# Patient Record
Sex: Male | Born: 1955 | Race: White | Hispanic: No | Marital: Married | Smoking: Former smoker
Health system: Southern US, Community
[De-identification: ages and names within clinical notes are randomized; demographics above are authoritative.]

## PROBLEM LIST (undated history)

## (undated) DIAGNOSIS — Z972 Presence of dental prosthetic device (complete) (partial): Secondary | ICD-10-CM

## (undated) HISTORY — PX: COLONOSCOPY: SHX174

---

## 2003-12-08 ENCOUNTER — Other Ambulatory Visit: Payer: Self-pay

## 2009-04-20 ENCOUNTER — Ambulatory Visit: Payer: Self-pay | Admitting: Gastroenterology

## 2009-04-20 LAB — HM COLONOSCOPY

## 2015-05-12 ENCOUNTER — Encounter: Payer: Self-pay | Admitting: Family Medicine

## 2015-05-14 ENCOUNTER — Other Ambulatory Visit: Payer: Self-pay | Admitting: Family Medicine

## 2015-05-19 ENCOUNTER — Ambulatory Visit (INDEPENDENT_AMBULATORY_CARE_PROVIDER_SITE_OTHER): Payer: BLUE CROSS/BLUE SHIELD | Admitting: Family Medicine

## 2015-05-19 ENCOUNTER — Encounter: Payer: Self-pay | Admitting: Family Medicine

## 2015-05-19 VITALS — BP 108/69 | HR 65 | Temp 98.0°F | Ht 67.8 in | Wt 150.0 lb

## 2015-05-19 DIAGNOSIS — Z0000 Encounter for general adult medical examination without abnormal findings: Secondary | ICD-10-CM | POA: Diagnosis not present

## 2015-05-19 LAB — URINALYSIS, ROUTINE W REFLEX MICROSCOPIC
Bilirubin, UA: NEGATIVE
Glucose, UA: NEGATIVE
Ketones, UA: NEGATIVE
Leukocytes, UA: NEGATIVE
Nitrite, UA: NEGATIVE
RBC UA: NEGATIVE
Specific Gravity, UA: 1.03 (ref 1.005–1.030)
Urobilinogen, Ur: 0.2 mg/dL (ref 0.2–1.0)
pH, UA: 5.5 (ref 5.0–7.5)

## 2015-05-19 NOTE — Progress Notes (Signed)
   BP 108/69 mmHg  Pulse 65  Temp(Src) 98 F (36.7 C)  Ht 5' 7.8" (1.722 m)  Wt 150 lb (68.04 kg)  BMI 22.95 kg/m2  SpO2 96%   Subjective:    Patient ID: Jesse Koch, male    DOB: 30-Nov-1955, 59 y.o.   MRN: [id number]  HPI: Jesse Koch is a 59 y.o. male  Chief Complaint  Patient presents with  . Annual Exam   Doing well No problems occ fever blister Relevant past medical, surgical, family and social history reviewed and updated as indicated. Interim medical history since our last visit reviewed. Allergies and medications reviewed and updated.  Review of Systems  Constitutional: Negative.   HENT: Negative.   Eyes: Negative.   Respiratory: Negative.   Cardiovascular: Negative.   Endocrine: Negative.   Musculoskeletal: Negative.   Skin: Negative.   Allergic/Immunologic: Negative.   Neurological: Negative.   Hematological: Negative.   Psychiatric/Behavioral: Negative.     Per HPI unless specifically indicated above     Objective:    BP 108/69 mmHg  Pulse 65  Temp(Src) 98 F (36.7 C)  Ht 5' 7.8" (1.722 m)  Wt 150 lb (68.04 kg)  BMI 22.95 kg/m2  SpO2 96%  Wt Readings from Last 3 Encounters:  05/19/15 150 lb (68.04 kg)  04/14/14 147 lb (66.679 kg)    Physical Exam  Constitutional: He is oriented to person, place, and time. He appears well-developed and well-nourished.  HENT:  Head: Normocephalic and atraumatic.  Right Ear: External ear normal.  Left Ear: External ear normal.  Eyes: Conjunctivae and EOM are normal. Pupils are equal, round, and reactive to light.  Neck: Normal range of motion. Neck supple.  Cardiovascular: Normal rate, regular rhythm, normal heart sounds and intact distal pulses.   Pulmonary/Chest: Effort normal and breath sounds normal.  Abdominal: Soft. Bowel sounds are normal. There is no splenomegaly or hepatomegaly.  Genitourinary: Rectum normal, prostate normal and penis normal.  Musculoskeletal: Normal range of motion.  Neurological:  He is alert and oriented to person, place, and time. He has normal reflexes.  Skin: No rash noted. No erythema.  Psychiatric: He has a normal mood and affect. His behavior is normal. Judgment and thought content normal.    No results found for this or any previous visit.    Assessment & Plan:   Problem List Items Addressed This Visit    None    Visit Diagnoses    PE (physical exam), annual    -  Primary    Relevant Orders    Comprehensive metabolic panel    CBC with Differential/Platelet    Urinalysis, Routine w reflex microscopic (not at [facility])    TSH    PSA    Lipid panel        Follow up plan: Return for Physical Exam.

## 2015-05-20 LAB — TSH: TSH: 2.11 u[IU]/mL (ref 0.450–4.500)

## 2015-05-20 LAB — COMPREHENSIVE METABOLIC PANEL
A/G RATIO: 2 (ref 1.1–2.5)
ALK PHOS: 76 IU/L (ref 39–117)
ALT: 19 IU/L (ref 0–44)
AST: 17 IU/L (ref 0–40)
Albumin: 4.3 g/dL (ref 3.5–5.5)
BILIRUBIN TOTAL: 0.5 mg/dL (ref 0.0–1.2)
BUN / CREAT RATIO: 18 (ref 9–20)
BUN: 17 mg/dL (ref 6–24)
CO2: 25 mmol/L (ref 18–29)
CREATININE: 0.96 mg/dL (ref 0.76–1.27)
Calcium: 9.1 mg/dL (ref 8.7–10.2)
Chloride: 100 mmol/L (ref 97–108)
GFR calc Af Amer: 102 mL/min/{1.73_m2} (ref >59)
GFR calc non Af Amer: 88 mL/min/{1.73_m2} (ref >59)
GLOBULIN, TOTAL: 2.2 g/dL (ref 1.5–4.5)
Glucose: 75 mg/dL (ref 65–99)
POTASSIUM: 4 mmol/L (ref 3.5–5.2)
SODIUM: 140 mmol/L (ref 134–144)
Total Protein: 6.5 g/dL (ref 6.0–8.5)

## 2015-05-20 LAB — LIPID PANEL
Chol/HDL Ratio: 3 ratio units (ref 0.0–5.0)
Cholesterol, Total: 140 mg/dL (ref 100–199)
HDL: 47 mg/dL (ref >39)
LDL CALC: 80 mg/dL (ref 0–99)
TRIGLYCERIDES: 67 mg/dL (ref 0–149)
VLDL Cholesterol Cal: 13 mg/dL (ref 5–40)

## 2015-05-20 LAB — CBC WITH DIFFERENTIAL/PLATELET
BASOS ABS: 0 10*3/uL (ref 0.0–0.2)
Basos: 0 %
EOS (ABSOLUTE): 0.1 10*3/uL (ref 0.0–0.4)
Eos: 2 %
Hematocrit: 42.7 % (ref 37.5–51.0)
Hemoglobin: 15.1 g/dL (ref 12.6–17.7)
IMMATURE GRANULOCYTES: 0 %
Immature Grans (Abs): 0 10*3/uL (ref 0.0–0.1)
Lymphocytes Absolute: 1.5 10*3/uL (ref 0.7–3.1)
Lymphs: 23 %
MCH: 31.8 pg (ref 26.6–33.0)
MCHC: 35.4 g/dL (ref 31.5–35.7)
MCV: 90 fL (ref 79–97)
Monocytes Absolute: 0.5 10*3/uL (ref 0.1–0.9)
Monocytes: 8 %
NEUTROS ABS: 4.1 10*3/uL (ref 1.4–7.0)
NEUTROS PCT: 67 %
Platelets: 258 10*3/uL (ref 150–379)
RBC: 4.75 x10E6/uL (ref 4.14–5.80)
RDW: 13.5 % (ref 12.3–15.4)
WBC: 6.2 10*3/uL (ref 3.4–10.8)

## 2015-05-20 LAB — PSA: PROSTATE SPECIFIC AG, SERUM: 1 ng/mL (ref 0.0–4.0)

## 2015-07-05 ENCOUNTER — Telehealth: Payer: Self-pay | Admitting: Family Medicine

## 2015-07-05 NOTE — Telephone Encounter (Signed)
Please call pt ASAP, stated he has been waiting 2 months on lab results. Thanks.

## 2015-08-09 ENCOUNTER — Encounter: Payer: Self-pay | Admitting: Family Medicine

## 2015-08-09 ENCOUNTER — Ambulatory Visit (INDEPENDENT_AMBULATORY_CARE_PROVIDER_SITE_OTHER): Payer: BLUE CROSS/BLUE SHIELD | Admitting: Family Medicine

## 2015-08-09 VITALS — BP 109/57 | HR 63 | Temp 97.6°F | Wt 150.0 lb

## 2015-08-09 DIAGNOSIS — M545 Low back pain: Secondary | ICD-10-CM

## 2015-08-09 DIAGNOSIS — M62830 Muscle spasm of back: Secondary | ICD-10-CM

## 2015-08-09 LAB — UA/M W/RFLX CULTURE, ROUTINE
Bilirubin, UA: NEGATIVE
GLUCOSE, UA: NEGATIVE
KETONES UA: NEGATIVE
LEUKOCYTES UA: NEGATIVE
NITRITE UA: NEGATIVE
PROTEIN UA: NEGATIVE
RBC, UA: NEGATIVE
SPEC GRAV UA: 1.015 (ref 1.005–1.030)
Urobilinogen, Ur: 1 mg/dL (ref 0.2–1.0)
pH, UA: 7 (ref 5.0–7.5)

## 2015-08-09 MED ORDER — HYDROCODONE-ACETAMINOPHEN 5-325 MG PO TABS: 1.0000 | ORAL_TABLET | Freq: Four times a day (QID) | ORAL | Status: DC | PRN

## 2015-08-09 MED ORDER — CYCLOBENZAPRINE HCL 10 MG PO TABS: 10.0000 mg | ORAL_TABLET | Freq: Three times a day (TID) | ORAL | Status: DC | PRN

## 2015-08-09 NOTE — Progress Notes (Signed)
BP 109/57 mmHg  Pulse 63  Temp(Src) 97.6 F (36.4 C)  Wt 150 lb (68.04 kg)  SpO2 99%   Subjective:    Patient ID: Jesse Koch, male    DOB: Aug 03, 1956, 59 y.o.   MRN: [id number]  HPI: Jesse Koch is a 59 y.o. male  Chief Complaint  Patient presents with  . Back Pain    X 1 week, patient has went to the chiropractor twice without any relief, so the chiropractor said something else is going on.  Left Flank Pain Pt presents to office today with c/o left flank pain onset 1 week ago.  Quality of pain is dull, pain is worse in the morning. Has went to the chiropractor twice since symptoms began without relief.  Has taken Ibuprofen 400 mg twice a day and hydrocodeine as needed for pain with relief of symptoms.  Has been drinking cranberry juice and has increased water intake.  Pertinent negatives denies trauma, dysuria, polyuria, urgency, frequency, numbness, , weakness, or bowel or bladder incontinence.   Relevant past medical, surgical, family and social history reviewed and updated as indicated. Interim medical history since our last visit reviewed. Allergies and medications reviewed and updated.  Review of Systems  Constitutional: Negative.   Cardiovascular: Negative for chest pain and palpitations.  Genitourinary: Negative for urgency, hematuria, decreased urine volume, discharge and penile pain.       Left flank pain  Neurological: Negative for numbness.    Per HPI unless specifically indicated above     Objective:    BP 109/57 mmHg  Pulse 63  Temp(Src) 97.6 F (36.4 C)  Wt 150 lb (68.04 kg)  SpO2 99%  Wt Readings from Last 3 Encounters:  08/09/15 150 lb (68.04 kg)  05/19/15 150 lb (68.04 kg)  04/14/14 147 lb (66.679 kg)    Physical Exam  Constitutional: He is oriented to person, place, and time. He appears well-developed and well-nourished. No distress.  HENT:  Head: Normocephalic and atraumatic.  Right Ear: External ear normal.  Left Ear: External ear normal.   Nose: Nose normal.  Neck: Normal range of motion. Neck supple.  Cardiovascular: Normal rate, regular rhythm and normal heart sounds.   Pulmonary/Chest: Effort normal and breath sounds normal. No respiratory distress. He exhibits no tenderness.  Musculoskeletal: Normal range of motion. He exhibits no edema.  Tenderness and tightness present at left flank.  FROM of back present.  Neurological: He is alert and oriented to person, place, and time.  Skin: Skin is warm and dry. He is not diaphoretic.  Psychiatric: He has a normal mood and affect. His behavior is normal. Judgment and thought content normal.    Results for orders placed or performed in visit on 05/19/15  Comprehensive metabolic panel  Result Value Ref Range   Glucose 75 65 - 99 mg/dL   BUN 17 6 - 24 mg/dL   Creatinine, Ser 0.96 0.76 - 1.27 mg/dL   GFR calc non Af Amer 88 >59 mL/min/1.73   GFR calc Af Amer 102 >59 mL/min/1.73   BUN/Creatinine Ratio 18 9 - 20   Sodium 140 134 - 144 mmol/L   Potassium 4.0 3.5 - 5.2 mmol/L   Chloride 100 97 - 108 mmol/L   CO2 25 18 - 29 mmol/L   Calcium 9.1 8.7 - 10.2 mg/dL   Total Protein 6.5 6.0 - 8.5 g/dL   Albumin 4.3 3.5 - 5.5 g/dL   Globulin, Total 2.2 1.5 - 4.5 g/dL   Albumin/Globulin  Ratio 2.0 1.1 - 2.5   Bilirubin Total 0.5 0.0 - 1.2 mg/dL   Alkaline Phosphatase 76 39 - 117 IU/L   AST 17 0 - 40 IU/L   ALT 19 0 - 44 IU/L  CBC with Differential/Platelet  Result Value Ref Range   WBC 6.2 3.4 - 10.8 x10E3/uL   RBC 4.75 4.14 - 5.80 x10E6/uL   Hemoglobin 15.1 12.6 - 17.7 g/dL   Hematocrit 42.7 37.5 - 51.0 %   MCV 90 79 - 97 fL   MCH 31.8 26.6 - 33.0 pg   MCHC 35.4 31.5 - 35.7 g/dL   RDW 13.5 12.3 - 15.4 %   Platelets 258 150 - 379 x10E3/uL   Neutrophils 67 %   Lymphs 23 %   Monocytes 8 %   Eos 2 %   Basos 0 %   Neutrophils Absolute 4.1 1.4 - 7.0 x10E3/uL   Lymphocytes Absolute 1.5 0.7 - 3.1 x10E3/uL   Monocytes Absolute 0.5 0.1 - 0.9 x10E3/uL   EOS (ABSOLUTE) 0.1 0.0 -  0.4 x10E3/uL   Basophils Absolute 0.0 0.0 - 0.2 x10E3/uL   Immature Granulocytes 0 %   Immature Grans (Abs) 0.0 0.0 - 0.1 x10E3/uL  Urinalysis, Routine w reflex microscopic (not at [facility])  Result Value Ref Range   Specific Gravity, UA 1.030 1.005 - 1.030   pH, UA 5.5 5.0 - 7.5   Color, UA Yellow Yellow   Appearance Ur Clear Clear   Leukocytes, UA Negative Negative   Protein, UA Trace Negative/Trace   Glucose, UA Negative Negative   Ketones, UA Negative Negative   RBC, UA Negative Negative   Bilirubin, UA Negative Negative   Urobilinogen, Ur 0.2 0.2 - 1.0 mg/dL   Nitrite, UA Negative Negative  TSH  Result Value Ref Range   TSH 2.110 0.450 - 4.500 uIU/mL  PSA  Result Value Ref Range   Prostate Specific Ag, Serum 1.0 0.0 - 4.0 ng/mL  Lipid panel  Result Value Ref Range   Cholesterol, Total 140 100 - 199 mg/dL   Triglycerides 67 0 - 149 mg/dL   HDL 47 >39 mg/dL   VLDL Cholesterol Cal 13 5 - 40 mg/dL   LDL Calculated 80 0 - 99 mg/dL   Chol/HDL Ratio 3.0 0.0 - 5.0 ratio units      Assessment & Plan:   Problem List Items Addressed This Visit      Other   Muscle spasm of back    Prescribed Cyclobenazaprine  (Flexiril) 10 mg 3 times a day as needed for muscle spasms Prescribed Hydrocodone-Acetaminophen 5-325 1 tab every 6 hours as needed for pain Instructed pt not to drive if he takes the abovementioned medications       Other Visit Diagnoses    Low back pain without sciatica, unspecified back pain laterality    -  Primary    Relevant Medications    HYDROcodone-acetaminophen (NORCO/VICODIN) 5-325 MG per tablet    cyclobenzaprine (FLEXERIL) 10 MG tablet    Other Relevant Orders    UA/M w/rflx Culture, Routine        Follow up plan: Return if symptoms worsen or fail to improve.    Seen today with Hinton Dyer, NP student.

## 2015-08-09 NOTE — Assessment & Plan Note (Signed)
Prescribed Cyclobenazaprine  (Flexiril) 10 mg 3 times a day as needed for muscle spasms Prescribed Hydrocodone-Acetaminophen 5-325 1 tab every 6 hours as needed for pain Instructed pt not to drive if he takes the abovementioned medications

## 2015-08-11 ENCOUNTER — Telehealth: Payer: Self-pay | Admitting: Family Medicine

## 2015-08-11 DIAGNOSIS — M545 Low back pain, unspecified: Secondary | ICD-10-CM

## 2015-08-11 NOTE — Telephone Encounter (Signed)
Pt called requests call back from Dr. Wynetta Emery. No further information provided. Thanks

## 2015-08-12 NOTE — Telephone Encounter (Signed)
Yes. Ordered. He can go to [place] to get it done

## 2015-08-12 NOTE — Telephone Encounter (Signed)
Called and spoke with patient, was wanted to know if the flexeril can cause drowsiness, let patient know that this is a possibility. Patient would like to know if you can order him an xray of his back.

## 2015-08-12 NOTE — Telephone Encounter (Signed)
Patient notified

## 2015-08-16 ENCOUNTER — Ambulatory Visit
Admission: RE | Admit: 2015-08-16 | Discharge: 2015-08-16 | Disposition: A | Discharge status: Home / Self Care | Payer: BLUE CROSS/BLUE SHIELD | Source: Ambulatory Visit | Attending: Family Medicine | Admitting: Family Medicine

## 2015-08-16 DIAGNOSIS — M545 Low back pain, unspecified: Secondary | ICD-10-CM

## 2015-08-16 DIAGNOSIS — I2510 Atherosclerotic heart disease of native coronary artery without angina pectoris: Secondary | ICD-10-CM | POA: Diagnosis not present

## 2015-08-17 ENCOUNTER — Telehealth: Payer: Self-pay | Admitting: Family Medicine

## 2015-08-17 DIAGNOSIS — M47816 Spondylosis without myelopathy or radiculopathy, lumbar region: Secondary | ICD-10-CM

## 2015-08-17 DIAGNOSIS — I700 Atherosclerosis of aorta: Secondary | ICD-10-CM

## 2015-08-17 NOTE — Telephone Encounter (Signed)
X-ray shows arthritis. Would likely benefit from PT. OK to let him know when he calls.

## 2015-08-18 NOTE — Telephone Encounter (Signed)
He is feeling better and doesn't think that he needs PT at this time. Will call if situation changes. Results given.

## 2016-02-22 ENCOUNTER — Encounter: Payer: Self-pay | Admitting: Family Medicine

## 2016-03-22 ENCOUNTER — Encounter: Payer: Self-pay | Admitting: Family Medicine

## 2016-05-22 ENCOUNTER — Encounter: Payer: BLUE CROSS/BLUE SHIELD | Admitting: Family Medicine

## 2016-07-11 ENCOUNTER — Ambulatory Visit (INDEPENDENT_AMBULATORY_CARE_PROVIDER_SITE_OTHER): Payer: BLUE CROSS/BLUE SHIELD | Admitting: Family Medicine

## 2016-07-11 ENCOUNTER — Encounter: Payer: Self-pay | Admitting: Family Medicine

## 2016-07-11 VITALS — BP 117/72 | HR 62 | Temp 97.3°F | Ht 68.1 in | Wt 149.8 lb

## 2016-07-11 DIAGNOSIS — Z0000 Encounter for general adult medical examination without abnormal findings: Secondary | ICD-10-CM | POA: Diagnosis not present

## 2016-07-11 LAB — URINALYSIS, ROUTINE W REFLEX MICROSCOPIC
BILIRUBIN UA: NEGATIVE
Glucose, UA: NEGATIVE
KETONES UA: NEGATIVE
LEUKOCYTES UA: NEGATIVE
NITRITE UA: NEGATIVE
Protein, UA: NEGATIVE
RBC UA: NEGATIVE
SPEC GRAV UA: 1.02 (ref 1.005–1.030)
UUROB: 0.2 mg/dL (ref 0.2–1.0)
pH, UA: 5.5 (ref 5.0–7.5)

## 2016-07-11 MED ORDER — FAMCICLOVIR 500 MG PO TABS: 500.0000 mg | ORAL_TABLET | Freq: Two times a day (BID) | ORAL | 12 refills | Status: DC

## 2016-07-11 NOTE — Progress Notes (Signed)
   BP 117/72 (BP Location: Left Arm, Patient Position: Sitting, Cuff Size: Normal)   Pulse 62   Temp 97.3 F (36.3 C)   Ht 5' 8.1" (1.73 m)   Wt 149 lb 12.8 oz (67.9 kg)   SpO2 98%   BMI 22.71 kg/m    Subjective:    Patient ID: Jesse Koch, male    DOB: 06/15/1956, 60 y.o.   MRN: [id number]  HPI: Jesse Koch is a 61 y.o. male  Chief Complaint  Patient presents with  . Annual Exam    Relevant past medical, surgical, family and social history reviewed and updated as indicated. Interim medical history since our last visit reviewed. Allergies and medications reviewed and updated.  Review of Systems  Constitutional: Negative.   HENT: Negative.   Eyes: Negative.   Respiratory: Negative.   Cardiovascular: Negative.   Gastrointestinal: Negative.   Endocrine: Negative.   Genitourinary: Negative.   Musculoskeletal: Negative.   Skin: Negative.   Allergic/Immunologic: Negative.   Neurological: Negative.   Hematological: Negative.   Psychiatric/Behavioral: Negative.     Per HPI unless specifically indicated above     Objective:    BP 117/72 (BP Location: Left Arm, Patient Position: Sitting, Cuff Size: Normal)   Pulse 62   Temp 97.3 F (36.3 C)   Ht 5' 8.1" (1.73 m)   Wt 149 lb 12.8 oz (67.9 kg)   SpO2 98%   BMI 22.71 kg/m   Wt Readings from Last 3 Encounters:  07/11/16 149 lb 12.8 oz (67.9 kg)  08/09/15 150 lb (68 kg)  05/19/15 150 lb (68 kg)    Physical Exam  Constitutional: He is oriented to person, place, and time. He appears well-developed and well-nourished.  HENT:  Head: Normocephalic and atraumatic.  Right Ear: External ear normal.  Left Ear: External ear normal.  Eyes: Conjunctivae and EOM are normal. Pupils are equal, round, and reactive to light.  Neck: Normal range of motion. Neck supple.  Cardiovascular: Normal rate, regular rhythm, normal heart sounds and intact distal pulses.   Pulmonary/Chest: Effort normal and breath sounds normal.  Abdominal:  Soft. Bowel sounds are normal. There is no splenomegaly or hepatomegaly.  Genitourinary: Rectum normal, prostate normal and penis normal.  Musculoskeletal: Normal range of motion.  Neurological: He is alert and oriented to person, place, and time. He has normal reflexes.  Skin: No rash noted. No erythema.  Psychiatric: He has a normal mood and affect. His behavior is normal. Judgment and thought content normal.    Results for orders placed or performed in visit on 07/11/16  HM COLONOSCOPY  Result Value Ref Range   HM Colonoscopy See Report (in chart) See Report (in chart), Patient Reported      Assessment & Plan:   Problem List Items Addressed This Visit    None    Visit Diagnoses    Annual physical exam    -  Primary   Relevant Orders   CBC with Differential/Platelet   Comprehensive metabolic panel   Lipid Panel w/o Chol/HDL Ratio   PSA   TSH   Urinalysis, Routine w reflex microscopic (not at [facility])   Health care maintenance       Relevant Orders   Hepatitis C antibody   HIV antibody       Follow up plan: Return in about 1 year (around 07/11/2017) for Physical Exam.

## 2016-07-12 ENCOUNTER — Encounter: Payer: Self-pay | Admitting: Family Medicine

## 2016-07-12 LAB — LIPID PANEL W/O CHOL/HDL RATIO
Cholesterol, Total: 137 mg/dL (ref 100–199)
HDL: 46 mg/dL (ref >39)
LDL CALC: 83 mg/dL (ref 0–99)
Triglycerides: 39 mg/dL (ref 0–149)
VLDL CHOLESTEROL CAL: 8 mg/dL (ref 5–40)

## 2016-07-12 LAB — CBC WITH DIFFERENTIAL/PLATELET
BASOS ABS: 0 10*3/uL (ref 0.0–0.2)
Basos: 0 %
EOS (ABSOLUTE): 0.2 10*3/uL (ref 0.0–0.4)
EOS: 4 %
HEMATOCRIT: 41.8 % (ref 37.5–51.0)
HEMOGLOBIN: 14.3 g/dL (ref 12.6–17.7)
IMMATURE GRANS (ABS): 0 10*3/uL (ref 0.0–0.1)
Immature Granulocytes: 0 %
LYMPHS ABS: 1.3 10*3/uL (ref 0.7–3.1)
LYMPHS: 21 %
MCH: 31.8 pg (ref 26.6–33.0)
MCHC: 34.2 g/dL (ref 31.5–35.7)
MCV: 93 fL (ref 79–97)
MONOCYTES: 9 %
Monocytes Absolute: 0.5 10*3/uL (ref 0.1–0.9)
NEUTROS ABS: 4.2 10*3/uL (ref 1.4–7.0)
Neutrophils: 66 %
Platelets: 259 10*3/uL (ref 150–379)
RBC: 4.5 x10E6/uL (ref 4.14–5.80)
RDW: 13.9 % (ref 12.3–15.4)
WBC: 6.3 10*3/uL (ref 3.4–10.8)

## 2016-07-12 LAB — COMPREHENSIVE METABOLIC PANEL
ALBUMIN: 4.2 g/dL (ref 3.5–5.5)
ALT: 13 IU/L (ref 0–44)
AST: 13 IU/L (ref 0–40)
Albumin/Globulin Ratio: 1.9 (ref 1.2–2.2)
Alkaline Phosphatase: 79 IU/L (ref 39–117)
BUN / CREAT RATIO: 12 (ref 9–20)
BUN: 10 mg/dL (ref 6–24)
Bilirubin Total: 0.8 mg/dL (ref 0.0–1.2)
CO2: 25 mmol/L (ref 18–29)
CREATININE: 0.82 mg/dL (ref 0.76–1.27)
Calcium: 8.6 mg/dL — ABNORMAL LOW (ref 8.7–10.2)
Chloride: 100 mmol/L (ref 96–106)
GFR calc non Af Amer: 97 mL/min/{1.73_m2} (ref >59)
GFR, EST AFRICAN AMERICAN: 113 mL/min/{1.73_m2} (ref >59)
GLOBULIN, TOTAL: 2.2 g/dL (ref 1.5–4.5)
GLUCOSE: 84 mg/dL (ref 65–99)
Potassium: 4.3 mmol/L (ref 3.5–5.2)
SODIUM: 140 mmol/L (ref 134–144)
TOTAL PROTEIN: 6.4 g/dL (ref 6.0–8.5)

## 2016-07-12 LAB — HEPATITIS C ANTIBODY

## 2016-07-12 LAB — TSH: TSH: 1.78 u[IU]/mL (ref 0.450–4.500)

## 2016-07-12 LAB — HIV ANTIBODY (ROUTINE TESTING W REFLEX): HIV SCREEN 4TH GENERATION: NONREACTIVE

## 2016-07-12 LAB — PSA: PROSTATE SPECIFIC AG, SERUM: 1 ng/mL (ref 0.0–4.0)

## 2017-02-14 ENCOUNTER — Ambulatory Visit (INDEPENDENT_AMBULATORY_CARE_PROVIDER_SITE_OTHER): Payer: BLUE CROSS/BLUE SHIELD | Admitting: Family Medicine

## 2017-02-14 ENCOUNTER — Encounter: Payer: Self-pay | Admitting: Family Medicine

## 2017-02-14 VITALS — BP 114/72 | HR 65 | Temp 99.0°F | Wt 152.0 lb

## 2017-02-14 DIAGNOSIS — K602 Anal fissure, unspecified: Secondary | ICD-10-CM | POA: Diagnosis not present

## 2017-02-14 MED ORDER — HYDROCORTISONE 2.5 % RE CREA: 1.0000 "application " | TOPICAL_CREAM | Freq: Two times a day (BID) | RECTAL | 0 refills | Status: DC

## 2017-02-14 MED ORDER — HYDROCODONE-ACETAMINOPHEN 5-325 MG PO TABS: 1.0000 | ORAL_TABLET | Freq: Three times a day (TID) | ORAL | 0 refills | Status: DC | PRN

## 2017-02-14 NOTE — Progress Notes (Signed)
   BP 114/72   Pulse 65   Temp 99 F (37.2 C)   Wt 152 lb (68.9 kg)   SpO2 98%   BMI 23.04 kg/m    Subjective:    Patient ID: Jesse Koch, male    DOB: 1956-05-08, 61 y.o.   MRN: [id number]  HPI: Jesse Koch is a 61 y.o. male  Chief Complaint  Patient presents with  . Abcess    Was seen at ED on 02/10/17 for a rectal abcess. Was told he had a fissure. Was given antibiotics, pain meds. Not much improved. Out of pain meds.    Patient presents for ER follow up from 3/17 for anal abscess and possible fissure. Has started clindamycin and taking hydrocodone prn for pain. States no major change since ED, still having 5/10 pain in the area. Denies fever, chills, drainage or bleeding, pain with BMs. ED recommended general surgery consult.   Relevant past medical, surgical, family and social history reviewed and updated as indicated. Interim medical history since our last visit reviewed. Allergies and medications reviewed and updated.  Review of Systems  Constitutional: Negative.   HENT: Negative.   Respiratory: Negative.   Gastrointestinal: Positive for rectal pain.  Genitourinary: Negative.   Musculoskeletal: Negative.   Neurological: Negative.   Psychiatric/Behavioral: Negative.     Per HPI unless specifically indicated above     Objective:    BP 114/72   Pulse 65   Temp 99 F (37.2 C)   Wt 152 lb (68.9 kg)   SpO2 98%   BMI 23.04 kg/m   Wt Readings from Last 3 Encounters:  02/14/17 152 lb (68.9 kg)  07/11/16 149 lb 12.8 oz (67.9 kg)  08/09/15 150 lb (68 kg)    Physical Exam  Constitutional: He is oriented to person, place, and time. He appears well-developed and well-nourished. No distress.  HENT:  Head: Atraumatic.  Eyes: Conjunctivae are normal. Pupils are equal, round, and reactive to light.  Neck: Normal range of motion. Neck supple.  Cardiovascular: Normal rate and normal heart sounds.   Pulmonary/Chest: Effort normal and breath sounds normal. No respiratory  distress.  Genitourinary:  Genitourinary Comments: Small non-inflamed hemorrhoid present at 7 o'clock Large cystic area of inflammation at 9 o'clock, non erythematous, minimally fluctuant, no drainage present, ttp  Musculoskeletal: Normal range of motion.  Lymphadenopathy:    He has no cervical adenopathy.  Neurological: He is alert and oriented to person, place, and time.  Skin: Skin is warm and dry.  Psychiatric: He has a normal mood and affect. His behavior is normal.  Nursing note and vitals reviewed.     Assessment & Plan:   Problem List Items Addressed This Visit    None    Visit Diagnoses    Anal fissure    -  Primary   General surgery referral placed for further eval and treatment. Continue abx, 15 hydrocodone given for prn use. Anusol suppositories prn for inflammation   Relevant Orders   Ambulatory referral to General Surgery       Follow up plan: Return if symptoms worsen or fail to improve.

## 2017-02-14 NOTE — Patient Instructions (Signed)
Follow up as needed

## 2017-03-08 ENCOUNTER — Ambulatory Visit: Payer: BLUE CROSS/BLUE SHIELD | Admitting: General Surgery

## 2017-07-12 ENCOUNTER — Encounter: Payer: Self-pay | Admitting: Family Medicine

## 2017-07-12 ENCOUNTER — Ambulatory Visit (INDEPENDENT_AMBULATORY_CARE_PROVIDER_SITE_OTHER): Payer: BLUE CROSS/BLUE SHIELD | Admitting: Family Medicine

## 2017-07-12 VITALS — BP 110/75 | HR 89 | Ht 68.9 in | Wt 149.0 lb

## 2017-07-12 DIAGNOSIS — Z13220 Encounter for screening for lipoid disorders: Secondary | ICD-10-CM

## 2017-07-12 DIAGNOSIS — M4696 Unspecified inflammatory spondylopathy, lumbar region: Secondary | ICD-10-CM

## 2017-07-12 DIAGNOSIS — Z131 Encounter for screening for diabetes mellitus: Secondary | ICD-10-CM | POA: Diagnosis not present

## 2017-07-12 DIAGNOSIS — Z125 Encounter for screening for malignant neoplasm of prostate: Secondary | ICD-10-CM | POA: Diagnosis not present

## 2017-07-12 DIAGNOSIS — M47816 Spondylosis without myelopathy or radiculopathy, lumbar region: Secondary | ICD-10-CM

## 2017-07-12 DIAGNOSIS — Z1329 Encounter for screening for other suspected endocrine disorder: Secondary | ICD-10-CM | POA: Diagnosis not present

## 2017-07-12 DIAGNOSIS — Z0000 Encounter for general adult medical examination without abnormal findings: Secondary | ICD-10-CM

## 2017-07-12 LAB — URINALYSIS, ROUTINE W REFLEX MICROSCOPIC
Bilirubin, UA: NEGATIVE
GLUCOSE, UA: NEGATIVE
LEUKOCYTES UA: NEGATIVE
NITRITE UA: NEGATIVE
RBC, UA: NEGATIVE
SPEC GRAV UA: 1.025 (ref 1.005–1.030)
Urobilinogen, Ur: 1 mg/dL (ref 0.2–1.0)
pH, UA: 6.5 (ref 5.0–7.5)

## 2017-07-12 LAB — MICROSCOPIC EXAMINATION
Bacteria, UA: NONE SEEN
RBC, UA: NONE SEEN /hpf (ref 0–?)

## 2017-07-12 MED ORDER — FAMCICLOVIR 500 MG PO TABS: 500.0000 mg | ORAL_TABLET | Freq: Two times a day (BID) | ORAL | 4 refills | Status: DC

## 2017-07-12 NOTE — Progress Notes (Signed)
BP 110/75   Pulse 89   Ht 5' 8.9" (1.75 m)   Wt 149 lb (67.6 kg)   SpO2 98%   BMI 22.07 kg/m    Subjective:    Patient ID: Jesse Koch, male    DOB: 12-Nov-1956, 61 y.o.   MRN: [id number]  HPI: Jesse Koch is a 61 y.o. male  Chief Complaint  Patient presents with  . Annual Exam  . Cough    Hacking cough.   Cough largely getting better still dry hacking cough has taken clindamycin for this which seems to have helped some. Takes Famvir on a when necessary basis rarely. Perirectal irritation resolved with antibiotics.  Relevant past medical, surgical, family and social history reviewed and updated as indicated. Interim medical history since our last visit reviewed. Allergies and medications reviewed and updated.  Review of Systems  Constitutional: Negative.   HENT: Negative.   Eyes: Negative.   Respiratory: Negative.   Cardiovascular: Negative.   Gastrointestinal: Negative.   Endocrine: Negative.   Genitourinary: Negative.   Musculoskeletal: Negative.   Skin: Negative.   Allergic/Immunologic: Negative.   Neurological: Negative.   Hematological: Negative.   Psychiatric/Behavioral: Negative.     Per HPI unless specifically indicated above     Objective:    BP 110/75   Pulse 89   Ht 5' 8.9" (1.75 m)   Wt 149 lb (67.6 kg)   SpO2 98%   BMI 22.07 kg/m   Wt Readings from Last 3 Encounters:  07/12/17 149 lb (67.6 kg)  02/14/17 152 lb (68.9 kg)  07/11/16 149 lb 12.8 oz (67.9 kg)    Physical Exam  Constitutional: He is oriented to person, place, and time. He appears well-developed and well-nourished.  HENT:  Head: Normocephalic and atraumatic.  Right Ear: External ear normal.  Left Ear: External ear normal.  Eyes: Pupils are equal, round, and reactive to light. Conjunctivae and EOM are normal.  Neck: Normal range of motion. Neck supple.  Cardiovascular: Normal rate, regular rhythm, normal heart sounds and intact distal pulses.   Pulmonary/Chest: Effort normal  and breath sounds normal.  Abdominal: Soft. Bowel sounds are normal. There is no splenomegaly or hepatomegaly.  Genitourinary: Rectum normal, prostate normal and penis normal.  Musculoskeletal: Normal range of motion.  Neurological: He is alert and oriented to person, place, and time. He has normal reflexes.  Skin: No rash noted. No erythema.  Psychiatric: He has a normal mood and affect. His behavior is normal. Judgment and thought content normal.    Results for orders placed or performed in visit on 07/11/16  CBC with Differential/Platelet  Result Value Ref Range   WBC 6.3 3.4 - 10.8 x10E3/uL   RBC 4.50 4.14 - 5.80 x10E6/uL   Hemoglobin 14.3 12.6 - 17.7 g/dL   Hematocrit 41.8 37.5 - 51.0 %   MCV 93 79 - 97 fL   MCH 31.8 26.6 - 33.0 pg   MCHC 34.2 31.5 - 35.7 g/dL   RDW 13.9 12.3 - 15.4 %   Platelets 259 150 - 379 x10E3/uL   Neutrophils 66 %   Lymphs 21 %   Monocytes 9 %   Eos 4 %   Basos 0 %   Neutrophils Absolute 4.2 1.4 - 7.0 x10E3/uL   Lymphocytes Absolute 1.3 0.7 - 3.1 x10E3/uL   Monocytes Absolute 0.5 0.1 - 0.9 x10E3/uL   EOS (ABSOLUTE) 0.2 0.0 - 0.4 x10E3/uL   Basophils Absolute 0.0 0.0 - 0.2 x10E3/uL   Immature  Granulocytes 0 %   Immature Grans (Abs) 0.0 0.0 - 0.1 x10E3/uL  Comprehensive metabolic panel  Result Value Ref Range   Glucose 84 65 - 99 mg/dL   BUN 10 6 - 24 mg/dL   Creatinine, Ser 0.82 0.76 - 1.27 mg/dL   GFR calc non Af Amer 97 >59 mL/min/1.73   GFR calc Af Amer 113 >59 mL/min/1.73   BUN/Creatinine Ratio 12 9 - 20   Sodium 140 134 - 144 mmol/L   Potassium 4.3 3.5 - 5.2 mmol/L   Chloride 100 96 - 106 mmol/L   CO2 25 18 - 29 mmol/L   Calcium 8.6 (L) 8.7 - 10.2 mg/dL   Total Protein 6.4 6.0 - 8.5 g/dL   Albumin 4.2 3.5 - 5.5 g/dL   Globulin, Total 2.2 1.5 - 4.5 g/dL   Albumin/Globulin Ratio 1.9 1.2 - 2.2   Bilirubin Total 0.8 0.0 - 1.2 mg/dL   Alkaline Phosphatase 79 39 - 117 IU/L   AST 13 0 - 40 IU/L   ALT 13 0 - 44 IU/L  Lipid Panel w/o  Chol/HDL Ratio  Result Value Ref Range   Cholesterol, Total 137 100 - 199 mg/dL   Triglycerides 39 0 - 149 mg/dL   HDL 46 >39 mg/dL   VLDL Cholesterol Cal 8 5 - 40 mg/dL   LDL Calculated 83 0 - 99 mg/dL  PSA  Result Value Ref Range   Prostate Specific Ag, Serum 1.0 0.0 - 4.0 ng/mL  TSH  Result Value Ref Range   TSH 1.780 0.450 - 4.500 uIU/mL  Urinalysis, Routine w reflex microscopic (not at [facility])  Result Value Ref Range   Specific Gravity, UA 1.020 1.005 - 1.030   pH, UA 5.5 5.0 - 7.5   Color, UA Yellow Yellow   Appearance Ur Clear Clear   Leukocytes, UA Negative Negative   Protein, UA Negative Negative/Trace   Glucose, UA Negative Negative   Ketones, UA Negative Negative   RBC, UA Negative Negative   Bilirubin, UA Negative Negative   Urobilinogen, Ur 0.2 0.2 - 1.0 mg/dL   Nitrite, UA Negative Negative  Hepatitis C antibody  Result Value Ref Range   Hep C Virus Ab <0.1 0.0 - 0.9 s/co ratio  HIV antibody  Result Value Ref Range   HIV Screen 4th Generation wRfx Non Reactive Non Reactive  HM COLONOSCOPY  Result Value Ref Range   HM Colonoscopy See Report (in chart) See Report (in chart), Patient Reported      Assessment & Plan:   Problem List Items Addressed This Visit      Musculoskeletal and Integument   Arthritis, lumbar spine (HCC)    The current medical regimen is effective;  continue present plan and medications.        Other Visit Diagnoses    Annual physical exam    -  Primary   Relevant Orders   CBC with Differential/Platelet   Screening for diabetes mellitus (DM)       Relevant Orders   Comprehensive metabolic panel   Urinalysis, Routine w reflex microscopic   Screening cholesterol level       Relevant Orders   Lipid panel   Prostate cancer screening       Relevant Orders   PSA   Thyroid disorder screen       Relevant Orders   TSH       Follow up plan: Return in about 1 year (around 07/12/2018).

## 2017-07-12 NOTE — Assessment & Plan Note (Signed)
The current medical regimen is effective;  continue present plan and medications.  

## 2017-07-13 LAB — COMPREHENSIVE METABOLIC PANEL
A/G RATIO: 2.1 (ref 1.2–2.2)
ALT: 14 IU/L (ref 0–44)
AST: 14 IU/L (ref 0–40)
Albumin: 4.2 g/dL (ref 3.5–5.5)
Alkaline Phosphatase: 85 IU/L (ref 39–117)
BILIRUBIN TOTAL: 0.6 mg/dL (ref 0.0–1.2)
BUN/Creatinine Ratio: 11 (ref 9–20)
BUN: 11 mg/dL (ref 6–24)
CHLORIDE: 104 mmol/L (ref 96–106)
CO2: 22 mmol/L (ref 20–29)
Calcium: 9 mg/dL (ref 8.7–10.2)
Creatinine, Ser: 0.97 mg/dL (ref 0.76–1.27)
GFR calc Af Amer: 98 mL/min/{1.73_m2} (ref >59)
GFR calc non Af Amer: 85 mL/min/{1.73_m2} (ref >59)
GLOBULIN, TOTAL: 2 g/dL (ref 1.5–4.5)
Glucose: 97 mg/dL (ref 65–99)
POTASSIUM: 4.1 mmol/L (ref 3.5–5.2)
SODIUM: 141 mmol/L (ref 134–144)
Total Protein: 6.2 g/dL (ref 6.0–8.5)

## 2017-07-13 LAB — TSH: TSH: 1.85 u[IU]/mL (ref 0.450–4.500)

## 2017-07-13 LAB — CBC WITH DIFFERENTIAL/PLATELET
BASOS: 1 %
Basophils Absolute: 0 10*3/uL (ref 0.0–0.2)
EOS (ABSOLUTE): 0.1 10*3/uL (ref 0.0–0.4)
EOS: 2 %
HEMOGLOBIN: 15.7 g/dL (ref 13.0–17.7)
Hematocrit: 44.7 % (ref 37.5–51.0)
Immature Grans (Abs): 0 10*3/uL (ref 0.0–0.1)
Immature Granulocytes: 0 %
LYMPHS ABS: 1.2 10*3/uL (ref 0.7–3.1)
Lymphs: 17 %
MCH: 32.7 pg (ref 26.6–33.0)
MCHC: 35.1 g/dL (ref 31.5–35.7)
MCV: 93 fL (ref 79–97)
MONOCYTES: 10 %
Monocytes Absolute: 0.7 10*3/uL (ref 0.1–0.9)
NEUTROS ABS: 5.1 10*3/uL (ref 1.4–7.0)
Neutrophils: 70 %
PLATELETS: 262 10*3/uL (ref 150–379)
RBC: 4.8 x10E6/uL (ref 4.14–5.80)
RDW: 13.7 % (ref 12.3–15.4)
WBC: 7.2 10*3/uL (ref 3.4–10.8)

## 2017-07-13 LAB — LIPID PANEL
Chol/HDL Ratio: 3.3 ratio (ref 0.0–5.0)
Cholesterol, Total: 140 mg/dL (ref 100–199)
HDL: 42 mg/dL (ref >39)
LDL Calculated: 85 mg/dL (ref 0–99)
TRIGLYCERIDES: 64 mg/dL (ref 0–149)
VLDL Cholesterol Cal: 13 mg/dL (ref 5–40)

## 2017-07-13 LAB — PSA: PROSTATE SPECIFIC AG, SERUM: 1.3 ng/mL (ref 0.0–4.0)

## 2017-07-16 ENCOUNTER — Encounter: Payer: Self-pay | Admitting: Family Medicine

## 2018-05-20 ENCOUNTER — Encounter: Payer: Self-pay | Admitting: Family Medicine

## 2018-07-15 ENCOUNTER — Encounter: Payer: BLUE CROSS/BLUE SHIELD | Admitting: Family Medicine

## 2018-09-11 ENCOUNTER — Encounter: Payer: Self-pay | Admitting: Family Medicine

## 2018-09-11 ENCOUNTER — Ambulatory Visit (INDEPENDENT_AMBULATORY_CARE_PROVIDER_SITE_OTHER): Payer: BLUE CROSS/BLUE SHIELD | Admitting: Family Medicine

## 2018-09-11 VITALS — BP 111/65 | HR 69 | Temp 97.8°F | Ht 67.1 in | Wt 144.2 lb

## 2018-09-11 DIAGNOSIS — M47816 Spondylosis without myelopathy or radiculopathy, lumbar region: Secondary | ICD-10-CM

## 2018-09-11 DIAGNOSIS — Z0000 Encounter for general adult medical examination without abnormal findings: Secondary | ICD-10-CM

## 2018-09-11 DIAGNOSIS — I700 Atherosclerosis of aorta: Secondary | ICD-10-CM

## 2018-09-11 LAB — URINALYSIS, ROUTINE W REFLEX MICROSCOPIC
BILIRUBIN UA: NEGATIVE
Glucose, UA: NEGATIVE
Ketones, UA: NEGATIVE
Leukocytes, UA: NEGATIVE
NITRITE UA: NEGATIVE
PH UA: 7 (ref 5.0–7.5)
Protein, UA: NEGATIVE
RBC, UA: NEGATIVE
Specific Gravity, UA: 1.015 (ref 1.005–1.030)
UUROB: 1 mg/dL (ref 0.2–1.0)

## 2018-09-11 NOTE — Assessment & Plan Note (Signed)
Stable no c/o 

## 2018-09-11 NOTE — Progress Notes (Signed)
   BP 111/65   Pulse 69   Temp 97.8 F (36.6 C) (Oral)   Ht 5' 7.1" (1.704 m)   Wt 144 lb 3.2 oz (65.4 kg)   SpO2 99%   BMI 22.52 kg/m    Subjective:    Patient ID: Jesse Koch, male    DOB: 1956-02-03, 62 y.o.   MRN: [id number]  HPI: [name] is a 62 y.o. male  Chief Complaint  Patient presents with  . Annual Exam   Patient all in all doing well no complaints back pain sees chiropractor from time to time.  No issues with cholesterol. No other specific complaints other than extra work. Patient also due colonoscopy will schedule Relevant past medical, surgical, family and social history reviewed and updated as indicated. Interim medical history since our last visit reviewed. Allergies and medications reviewed and updated.  Review of Systems  Constitutional: Negative.   HENT: Negative.   Eyes: Negative.   Respiratory: Negative.   Cardiovascular: Negative.   Gastrointestinal: Negative.   Endocrine: Negative.   Genitourinary: Negative.   Musculoskeletal: Negative.   Skin: Negative.   Allergic/Immunologic: Negative.   Neurological: Negative.   Hematological: Negative.   Psychiatric/Behavioral: Negative.     Per HPI unless specifically indicated above     Objective:    BP 111/65   Pulse 69   Temp 97.8 F (36.6 C) (Oral)   Ht 5' 7.1" (1.704 m)   Wt 144 lb 3.2 oz (65.4 kg)   SpO2 99%   BMI 22.52 kg/m   Wt Readings from Last 3 Encounters:  09/11/18 144 lb 3.2 oz (65.4 kg)  07/12/17 149 lb (67.6 kg)  02/14/17 152 lb (68.9 kg)    Physical Exam  Constitutional: He is oriented to person, place, and time. He appears well-developed and well-nourished.  HENT:  Head: Normocephalic and atraumatic.  Right Ear: External ear normal.  Left Ear: External ear normal.  Eyes: Pupils are equal, round, and reactive to light. Conjunctivae and EOM are normal.  Neck: Normal range of motion. Neck supple.  Cardiovascular: Normal rate, regular rhythm, normal heart sounds and  intact distal pulses.  Pulmonary/Chest: Effort normal and breath sounds normal.  Abdominal: Soft. Bowel sounds are normal. There is no splenomegaly or hepatomegaly.  Genitourinary: Rectum normal, prostate normal and penis normal.  Musculoskeletal: Normal range of motion.  Neurological: He is alert and oriented to person, place, and time. He has normal reflexes.  Skin: No rash noted. No erythema.  Psychiatric: He has a normal mood and affect. His behavior is normal. Judgment and thought content normal.        Assessment & Plan:   Problem List Items Addressed This Visit      Cardiovascular and Mediastinum   Aortic atherosclerosis ([place])    Stable no c/o      Relevant Orders   Ambulatory referral to Gastroenterology     Musculoskeletal and Integument   Arthritis, lumbar spine    Stable sees chiropractor and does well.       Other Visit Diagnoses    PE (physical exam), annual    -  Primary   Relevant Orders   Comprehensive metabolic panel   Lipid panel   CBC with Differential/Platelet   TSH   Urinalysis, Routine w reflex microscopic   PSA       Follow up plan: Return in about 1 year (around 09/12/2019) for Physical Exam.

## 2018-09-11 NOTE — Assessment & Plan Note (Signed)
Stable sees chiropractor and does well.

## 2018-09-12 ENCOUNTER — Encounter: Payer: Self-pay | Admitting: Family Medicine

## 2018-09-12 LAB — COMPREHENSIVE METABOLIC PANEL
ALBUMIN: 4.2 g/dL (ref 3.6–4.8)
ALK PHOS: 82 IU/L (ref 39–117)
ALT: 22 IU/L (ref 0–44)
AST: 19 IU/L (ref 0–40)
Albumin/Globulin Ratio: 1.8 (ref 1.2–2.2)
BILIRUBIN TOTAL: 0.4 mg/dL (ref 0.0–1.2)
BUN / CREAT RATIO: 9 — AB (ref 10–24)
BUN: 9 mg/dL (ref 8–27)
CHLORIDE: 101 mmol/L (ref 96–106)
CO2: 25 mmol/L (ref 20–29)
Calcium: 8.8 mg/dL (ref 8.6–10.2)
Creatinine, Ser: 0.95 mg/dL (ref 0.76–1.27)
GFR calc Af Amer: 100 mL/min/{1.73_m2} (ref >59)
GFR calc non Af Amer: 87 mL/min/{1.73_m2} (ref >59)
GLUCOSE: 81 mg/dL (ref 65–99)
Globulin, Total: 2.3 g/dL (ref 1.5–4.5)
Potassium: 4.2 mmol/L (ref 3.5–5.2)
Sodium: 140 mmol/L (ref 134–144)
Total Protein: 6.5 g/dL (ref 6.0–8.5)

## 2018-09-12 LAB — CBC WITH DIFFERENTIAL/PLATELET
BASOS: 1 %
Basophils Absolute: 0 10*3/uL (ref 0.0–0.2)
EOS (ABSOLUTE): 0.3 10*3/uL (ref 0.0–0.4)
EOS: 5 %
HEMATOCRIT: 42 % (ref 37.5–51.0)
HEMOGLOBIN: 14.6 g/dL (ref 13.0–17.7)
IMMATURE GRANS (ABS): 0 10*3/uL (ref 0.0–0.1)
Immature Granulocytes: 0 %
LYMPHS: 20 %
Lymphocytes Absolute: 1.2 10*3/uL (ref 0.7–3.1)
MCH: 31.5 pg (ref 26.6–33.0)
MCHC: 34.8 g/dL (ref 31.5–35.7)
MCV: 91 fL (ref 79–97)
MONOCYTES: 8 %
MONOS ABS: 0.5 10*3/uL (ref 0.1–0.9)
NEUTROS PCT: 66 %
Neutrophils Absolute: 3.8 10*3/uL (ref 1.4–7.0)
PLATELETS: 279 10*3/uL (ref 150–450)
RBC: 4.64 x10E6/uL (ref 4.14–5.80)
RDW: 12.8 % (ref 12.3–15.4)
WBC: 5.8 10*3/uL (ref 3.4–10.8)

## 2018-09-12 LAB — LIPID PANEL
CHOLESTEROL TOTAL: 152 mg/dL (ref 100–199)
Chol/HDL Ratio: 3.3 ratio (ref 0.0–5.0)
HDL: 46 mg/dL (ref >39)
LDL Calculated: 94 mg/dL (ref 0–99)
Triglycerides: 60 mg/dL (ref 0–149)
VLDL CHOLESTEROL CAL: 12 mg/dL (ref 5–40)

## 2018-09-12 LAB — TSH: TSH: 2.2 u[IU]/mL (ref 0.450–4.500)

## 2018-09-12 LAB — PSA: Prostate Specific Ag, Serum: 1.1 ng/mL (ref 0.0–4.0)

## 2018-10-08 ENCOUNTER — Other Ambulatory Visit: Payer: Self-pay | Admitting: Family Medicine

## 2018-10-23 ENCOUNTER — Other Ambulatory Visit: Payer: Self-pay

## 2018-10-23 DIAGNOSIS — Z1211 Encounter for screening for malignant neoplasm of colon: Secondary | ICD-10-CM

## 2018-11-13 ENCOUNTER — Other Ambulatory Visit: Payer: Self-pay

## 2018-11-13 ENCOUNTER — Encounter: Payer: Self-pay | Admitting: *Deleted

## 2018-11-14 ENCOUNTER — Other Ambulatory Visit: Payer: Self-pay | Admitting: Family Medicine

## 2018-11-14 NOTE — Telephone Encounter (Signed)
Requested medication (s) are due for refill today: Yes  Requested medication (s) are on the active medication list: No  Last refill:  07/12/17  Future visit scheduled: Yes  Notes to clinic:  Expired, unable to refill     Requested Prescriptions  Pending Prescriptions Disp Refills   famciclovir (FAMVIR) 500 MG tablet [Pharmacy Med Name: FAMCICLOVIR 500 MG TABLET] 180 tablet 4    Sig: TAKE 1 TABLET BY MOUTH TWICE A DAY     Antimicrobials:  Antiviral Agents - Anti-Herpetic Passed - 11/14/2018  3:24 PM      Passed - Valid encounter within last 12 months    Recent Outpatient Visits          2 months ago PE (physical exam), annual   [facility] Crissman, Jeannette How, MD   1 year ago Annual physical exam   [facility] Guadalupe Maple, MD   1 year ago Anal fissure   [facility], [place], [place]   2 years ago Annual physical exam   [facility] Crissman, Jeannette How, MD   3 years ago Low back pain without sciatica, unspecified back pain laterality   [facility] Valerie Roys, DO      Future Appointments            In 10 months Crissman, Jeannette How, MD [facility], [place]

## 2018-11-14 NOTE — Telephone Encounter (Signed)
Patient called and asked about the refill request. He says he takes it only if he gets a fever blister and the bottle he has expired in August. He says he doesn't have a fever blister at present.

## 2018-11-21 NOTE — Discharge Instructions (Signed)
General Anesthesia, Adult, Care After  This sheet gives you information about how to care for yourself after your procedure. Your health care provider may also give you more specific instructions. If you have problems or questions, contact your health care provider.  What can I expect after the procedure?  After the procedure, the following side effects are common:  Pain or discomfort at the IV site.  Nausea.  Vomiting.  Sore throat.  Trouble concentrating.  Feeling cold or chills.  Weak or tired.  Sleepiness and fatigue.  Soreness and body aches. These side effects can affect parts of the body that were not involved in surgery.  Follow these instructions at home:    For at least 24 hours after the procedure:  Have a responsible adult stay with you. It is important to have someone help care for you until you are awake and alert.  Rest as needed.  Do not:  Participate in activities in which you could fall or become injured.  Drive.  Use heavy machinery.  Drink alcohol.  Take sleeping pills or medicines that cause drowsiness.  Make important decisions or sign legal documents.  Take care of children on your own.  Eating and drinking  Follow any instructions from your health care provider about eating or drinking restrictions.  When you feel hungry, start by eating small amounts of foods that are soft and easy to digest (bland), such as toast. Gradually return to your regular diet.  Drink enough fluid to keep your urine pale yellow.  If you vomit, rehydrate by drinking water, juice, or clear broth.  General instructions  If you have sleep apnea, surgery and certain medicines can increase your risk for breathing problems. Follow instructions from your health care provider about wearing your sleep device:  Anytime you are sleeping, including during daytime naps.  While taking prescription pain medicines, sleeping medicines, or medicines that make you drowsy.  Return to your normal activities as told by your health care  provider. Ask your health care provider what activities are safe for you.  Take over-the-counter and prescription medicines only as told by your health care provider.  If you smoke, do not smoke without supervision.  Keep all follow-up visits as told by your health care provider. This is important.  Contact a health care provider if:  You have nausea or vomiting that does not get better with medicine.  You cannot eat or drink without vomiting.  You have pain that does not get better with medicine.  You are unable to pass urine.  You develop a skin rash.  You have a fever.  You have redness around your IV site that gets worse.  Get help right away if:  You have difficulty breathing.  You have chest pain.  You have blood in your urine or stool, or you vomit blood.  Summary  After the procedure, it is common to have a sore throat or nausea. It is also common to feel tired.  Have a responsible adult stay with you for the first 24 hours after general anesthesia. It is important to have someone help care for you until you are awake and alert.  When you feel hungry, start by eating small amounts of foods that are soft and easy to digest (bland), such as toast. Gradually return to your regular diet.  Drink enough fluid to keep your urine pale yellow.  Return to your normal activities as told by your health care provider. Ask your health care   provider what activities are safe for you.  This information is not intended to replace advice given to you by your health care provider. Make sure you discuss any questions you have with your health care provider.  Document Released: 02/19/2001 Document Revised: 06/29/2017 Document Reviewed: 06/29/2017  Elsevier Interactive Patient Education  2019 [employer]

## 2018-11-22 ENCOUNTER — Encounter
Admission: RE | Disposition: A | Discharge status: Home / Self Care | Payer: Self-pay | Source: Home / Self Care | Attending: Gastroenterology

## 2018-11-22 ENCOUNTER — Ambulatory Visit
Admission: RE | Admit: 2018-11-22 | Discharge: 2018-11-22 | Disposition: A | Discharge status: Home / Self Care | Payer: BLUE CROSS/BLUE SHIELD | Attending: Gastroenterology | Admitting: Gastroenterology

## 2018-11-22 ENCOUNTER — Ambulatory Visit: Payer: BLUE CROSS/BLUE SHIELD | Admitting: Anesthesiology

## 2018-11-22 DIAGNOSIS — K641 Second degree hemorrhoids: Secondary | ICD-10-CM | POA: Insufficient documentation

## 2018-11-22 DIAGNOSIS — Z1211 Encounter for screening for malignant neoplasm of colon: Secondary | ICD-10-CM

## 2018-11-22 DIAGNOSIS — Z87891 Personal history of nicotine dependence: Secondary | ICD-10-CM | POA: Insufficient documentation

## 2018-11-22 DIAGNOSIS — Z79899 Other long term (current) drug therapy: Secondary | ICD-10-CM | POA: Insufficient documentation

## 2018-11-22 DIAGNOSIS — D122 Benign neoplasm of ascending colon: Secondary | ICD-10-CM

## 2018-11-22 DIAGNOSIS — K635 Polyp of colon: Secondary | ICD-10-CM | POA: Diagnosis not present

## 2018-11-22 DIAGNOSIS — D125 Benign neoplasm of sigmoid colon: Secondary | ICD-10-CM | POA: Insufficient documentation

## 2018-11-22 DIAGNOSIS — Z880 Allergy status to penicillin: Secondary | ICD-10-CM | POA: Insufficient documentation

## 2018-11-22 HISTORY — PX: POLYPECTOMY: SHX149

## 2018-11-22 HISTORY — PX: COLONOSCOPY WITH PROPOFOL: SHX5780

## 2018-11-22 HISTORY — DX: Presence of dental prosthetic device (complete) (partial): Z97.2

## 2018-11-22 SURGERY — COLONOSCOPY WITH PROPOFOL
Anesthesia: General

## 2018-11-22 MED ORDER — PROPOFOL 10 MG/ML IV BOLUS
INTRAVENOUS | Status: DC | PRN
  Administered 2018-11-22 (×6): 50 mg via INTRAVENOUS

## 2018-11-22 MED ORDER — LACTATED RINGERS IV SOLN
INTRAVENOUS | Status: DC
  Administered 2018-11-22: 08:00:00 via INTRAVENOUS

## 2018-11-22 MED ORDER — LIDOCAINE HCL (CARDIAC) PF 100 MG/5ML IV SOSY
PREFILLED_SYRINGE | INTRAVENOUS | Status: DC | PRN
  Administered 2018-11-22: 40 mg via INTRAVENOUS

## 2018-11-22 MED ORDER — ACETAMINOPHEN 325 MG PO TABS: 325.0000 mg | ORAL_TABLET | Freq: Once | ORAL | Status: DC

## 2018-11-22 MED ORDER — ACETAMINOPHEN 160 MG/5ML PO SOLN: 325.0000 mg | Freq: Once | ORAL | Status: DC

## 2018-11-22 SURGICAL SUPPLY — 24 items
CANISTER SUCT 1200ML W/VALVE (MISCELLANEOUS) ×4 IMPLANT
CLIP HMST 235XBRD CATH ROT (MISCELLANEOUS) IMPLANT
CLIP RESOLUTION 360 11X235 (MISCELLANEOUS)
ELECT REM PT RETURN 9FT ADLT (ELECTROSURGICAL)
ELECTRODE REM PT RTRN 9FT ADLT (ELECTROSURGICAL) IMPLANT
FCP ESCP3.2XJMB 240X2.8X (MISCELLANEOUS)
FORCEPS BIOP RAD 4 LRG CAP 4 (CUTTING FORCEPS) IMPLANT
FORCEPS BIOP RJ4 240 W/NDL (MISCELLANEOUS)
FORCEPS ESCP3.2XJMB 240X2.8X (MISCELLANEOUS) IMPLANT
GOWN CVR UNV OPN BCK APRN NK (MISCELLANEOUS) ×4 IMPLANT
GOWN ISOL THUMB LOOP REG UNIV (MISCELLANEOUS) ×4
INJECTOR VARIJECT VIN23 (MISCELLANEOUS) IMPLANT
KIT DEFENDO VALVE AND CONN (KITS) IMPLANT
KIT ENDO PROCEDURE OLY (KITS) ×4 IMPLANT
MARKER SPOT ENDO TATTOO 5ML (MISCELLANEOUS) IMPLANT
PROBE APC STR FIRE (PROBE) IMPLANT
RETRIEVER NET ROTH 2.5X230 LF (MISCELLANEOUS) IMPLANT
SNARE SHORT THROW 13M SML OVAL (MISCELLANEOUS) IMPLANT
SNARE SHORT THROW 30M LRG OVAL (MISCELLANEOUS) IMPLANT
SNARE SNG USE RND 15MM (INSTRUMENTS) IMPLANT
SPOT EX ENDOSCOPIC TATTOO (MISCELLANEOUS)
TRAP ETRAP POLY (MISCELLANEOUS) IMPLANT
VARIJECT INJECTOR VIN23 (MISCELLANEOUS)
WATER STERILE IRR 250ML POUR (IV SOLUTION) ×4 IMPLANT

## 2018-11-22 NOTE — H&P (Signed)
Lucilla Lame, MD [address] [place],  [address] Phone: [phone] Fax : [phone]  Primary Care Physician:  Guadalupe Maple, MD Primary Gastroenterologist:  Dr. Allen Norris  Pre-Procedure History & Physical: HPI:  Jesse Koch is a 62 y.o. male is here for a screening colonoscopy.   Past Medical History:  Diagnosis Date  . Wears dentures    partial bottom    Past Surgical History:  Procedure Laterality Date  . COLONOSCOPY      Prior to Admission medications   Medication Sig Start Date End Date Taking? Authorizing Provider  famciclovir (FAMVIR) 500 MG tablet TAKE 1 TABLET BY MOUTH TWICE A DAY 11/15/18  Yes Guadalupe Maple, MD    Allergies as of 10/23/2018 - Review Complete 09/11/2018  Allergen Reaction Noted  . Penicillins Itching 05/04/2015    Family History  Problem Relation Age of Onset  . Cancer Mother        lung    Social History   Socioeconomic History  . Marital status: Married    Spouse name: Not on file  . Number of children: Not on file  . Years of education: Not on file  . Highest education level: Not on file  Occupational History  . Not on file  Social Needs  . Financial resource strain: Not on file  . Food insecurity:    Worry: Not on file    Inability: Not on file  . Transportation needs:    Medical: Not on file    Non-medical: Not on file  Tobacco Use  . Smoking status: Former Smoker    Packs/day: 1.00    Years: 40.00    Pack years: 40.00    Types: Cigarettes    Last attempt to quit: 05/18/2010    Years since quitting: 8.5  . Smokeless tobacco: Never Used  . Tobacco comment: smokes occasional small cigar  Substance and Sexual Activity  . Alcohol use: Yes    Alcohol/week: 2.0 standard drinks    Types: 2 Cans of beer per week    Comment: occasional  . Drug use: No  . Sexual activity: Yes  Lifestyle  . Physical activity:    Days per week: Not on file    Minutes per session: Not on file  . Stress: Not on  file  Relationships  . Social connections:    Talks on phone: Not on file    Gets together: Not on file    Attends religious service: Not on file    Active member of club or organization: Not on file    Attends meetings of clubs or organizations: Not on file    Relationship status: Not on file  . Intimate partner violence:    Fear of current or ex partner: Not on file    Emotionally abused: Not on file    Physically abused: Not on file    Forced sexual activity: Not on file  Other Topics Concern  . Not on file  Social History Narrative  . Not on file    Review of Systems: See HPI, otherwise negative ROS  Physical Exam: BP (!) 124/56   Pulse 94   Temp 98.1 F (36.7 C)   Ht 5\' 6"  (1.676 m)   Wt 64.9 kg   SpO2 100%   BMI 23.08 kg/m  General:   Alert,  pleasant and cooperative in NAD Head:  Normocephalic and atraumatic. Neck:  Supple; no masses or thyromegaly. Lungs:  Clear throughout  to auscultation.    Heart:  Regular rate and rhythm. Abdomen:  Soft, nontender and nondistended. Normal bowel sounds, without guarding, and without rebound.   Neurologic:  Alert and  oriented x4;  grossly normal neurologically.  Impression/Plan: Jesse Koch is now here to undergo a screening colonoscopy.  Risks, benefits, and alternatives regarding colonoscopy have been reviewed with the patient.  Questions have been answered.  All parties agreeable.

## 2018-11-22 NOTE — Op Note (Signed)
[facility] Patient Name: Jesse Koch Procedure Date: 11/22/2018 8:19 AM MRN: [id number] Account #: [id number] Date of Birth: 03-26-1956 Admit Type: Outpatient Age: 62 Room: [facility] OR ROOM 01 Gender: Male Note Status: Finalized Procedure:            Colonoscopy Indications:          Screening for colorectal malignant neoplasm Providers:            Lucilla Lame MD, MD Referring MD:         Guadalupe Maple, MD (Referring MD) Medicines:            Propofol per Anesthesia Complications:        No immediate complications. Procedure:            Pre-Anesthesia Assessment:                       - Prior to the procedure, a History and Physical was                        performed, and patient medications and allergies were                        reviewed. The patient's tolerance of previous                        anesthesia was also reviewed. The risks and benefits of                        the procedure and the sedation options and risks were                        discussed with the patient. All questions were                        answered, and informed consent was obtained. Prior                        Anticoagulants: The patient has taken no previous                        anticoagulant or antiplatelet agents. ASA Grade                        Assessment: II - A patient with mild systemic disease.                        After reviewing the risks and benefits, the patient was                        deemed in satisfactory condition to undergo the                        procedure.                       After obtaining informed consent, the colonoscope was                        passed under direct vision. Throughout the procedure,  the patient's blood pressure, pulse, and oxygen                        saturations were monitored continuously. The was                        introduced through the anus and advanced to the the             cecum, identified by appendiceal orifice and ileocecal                        valve. The colonoscopy was performed without                        difficulty. The patient tolerated the procedure well.                        The quality of the bowel preparation was excellent. Findings:      The perianal and digital rectal examinations were normal.      A 3 mm polyp was found in the ascending colon. The polyp was sessile.       The polyp was removed with a cold biopsy forceps. Resection and       retrieval were complete.      Three sessile polyps were found in the sigmoid colon. The polyps were 2       to 3 mm in size. These polyps were removed with a cold biopsy forceps.       Resection and retrieval were complete.      Non-bleeding internal hemorrhoids were found during retroflexion. The       hemorrhoids were Grade II (internal hemorrhoids that prolapse but reduce       spontaneously). Impression:           - One 3 mm polyp in the ascending colon, removed with a                        cold biopsy forceps. Resected and retrieved.                       - Three 2 to 3 mm polyps in the sigmoid colon, removed                        with a cold biopsy forceps. Resected and retrieved.                       - Non-bleeding internal hemorrhoids. Recommendation:       - Discharge patient to home.                       - Resume previous diet.                       - Continue present medications.                       - Await pathology results.                       - Repeat colonoscopy in 5 years if polyp adenoma and 10  years if hyperplastic Procedure Code(s):    --- Professional ---                       [phone], Colonoscopy, flexible; with biopsy, single or                        multiple Diagnosis Code(s):    --- Professional ---                       Z12.11, Encounter for screening for malignant neoplasm                        of colon                        D12.2, Benign neoplasm of ascending colon                       D12.5, Benign neoplasm of sigmoid colon CPT copyright 2018 American Medical Association. All rights reserved. The codes documented in this report are preliminary and upon coder review may  be revised to meet current compliance requirements. Lucilla Lame MD, MD 11/22/2018 8:41:41 AM This report has been signed electronically. Number of Addenda: 0 Note Initiated On: 11/22/2018 8:19 AM Scope Withdrawal Time: 0 hours 8 minutes 23 seconds  Total Procedure Duration: 0 hours 9 minutes 54 seconds       [facility]

## 2018-11-22 NOTE — Anesthesia Preprocedure Evaluation (Signed)
Anesthesia Evaluation  Patient identified by MRN, date of birth, ID band Patient awake    Reviewed: Allergy & Precautions, H&P , NPO status , Patient's Chart, lab work & pertinent test results  Airway Mallampati: II  TM Distance: >3 FB Neck ROM: full    Dental no notable dental hx.    Pulmonary former smoker,    Pulmonary exam normal breath sounds clear to auscultation       Cardiovascular Normal cardiovascular exam Rhythm:regular Rate:Normal     Neuro/Psych    GI/Hepatic   Endo/Other    Renal/GU      Musculoskeletal   Abdominal   Peds  Hematology   Anesthesia Other Findings   Reproductive/Obstetrics                             Anesthesia Physical Anesthesia Plan  ASA: II  Anesthesia Plan: General   Post-op Pain Management:    Induction: Intravenous  PONV Risk Score and Plan: 2 and Propofol infusion and Treatment may vary due to age or medical condition  Airway Management Planned: Natural Airway  Additional Equipment:   Intra-op Plan:   Post-operative Plan:   Informed Consent: I have reviewed the patients History and Physical, chart, labs and discussed the procedure including the risks, benefits and alternatives for the proposed anesthesia with the patient or authorized representative who has indicated his/her understanding and acceptance.     Plan Discussed with: CRNA  Anesthesia Plan Comments:         Anesthesia Quick Evaluation

## 2018-11-22 NOTE — Anesthesia Postprocedure Evaluation (Signed)
Anesthesia Post Note  Patient: Jesse Koch  Procedure(s) Performed: COLONOSCOPY WITH PROPOFOL (N/A ) POLYPECTOMY INTESTINAL  Patient location during evaluation: PACU Anesthesia Type: General Level of consciousness: awake and alert and oriented Pain management: satisfactory to patient Vital Signs Assessment: post-procedure vital signs reviewed and stable Respiratory status: spontaneous breathing, nonlabored ventilation and respiratory function stable Cardiovascular status: blood pressure returned to baseline and stable Postop Assessment: Adequate PO intake and No signs of nausea or vomiting Anesthetic complications: no    Raliegh Ip

## 2018-11-22 NOTE — Transfer of Care (Signed)
Immediate Anesthesia Transfer of Care Note  Patient: Jesse Koch  Procedure(s) Performed: COLONOSCOPY WITH PROPOFOL (N/A )  Patient Location: PACU  Anesthesia Type: General  Level of Consciousness: awake, alert  and patient cooperative  Airway and Oxygen Therapy: Patient Spontanous Breathing and Patient connected to supplemental oxygen  Post-op Assessment: Post-op Vital signs reviewed, Patient's Cardiovascular Status Stable, Respiratory Function Stable, Patent Airway and No signs of Nausea or vomiting  Post-op Vital Signs: Reviewed and stable  Complications: No apparent anesthesia complications

## 2018-11-25 ENCOUNTER — Encounter: Payer: Self-pay | Admitting: Gastroenterology

## 2018-11-26 ENCOUNTER — Encounter: Payer: Self-pay | Admitting: Gastroenterology

## 2018-12-10 ENCOUNTER — Encounter: Payer: Self-pay | Admitting: Gastroenterology

## 2019-09-15 ENCOUNTER — Encounter: Payer: BLUE CROSS/BLUE SHIELD | Admitting: Family Medicine

## 2019-09-24 ENCOUNTER — Other Ambulatory Visit: Payer: Self-pay

## 2019-09-24 ENCOUNTER — Ambulatory Visit (INDEPENDENT_AMBULATORY_CARE_PROVIDER_SITE_OTHER): Payer: BC Managed Care – PPO | Admitting: Family Medicine

## 2019-09-24 ENCOUNTER — Encounter: Payer: Self-pay | Admitting: Family Medicine

## 2019-09-24 DIAGNOSIS — B001 Herpesviral vesicular dermatitis: Secondary | ICD-10-CM | POA: Diagnosis not present

## 2019-09-24 DIAGNOSIS — Z0000 Encounter for general adult medical examination without abnormal findings: Secondary | ICD-10-CM

## 2019-09-24 DIAGNOSIS — I700 Atherosclerosis of aorta: Secondary | ICD-10-CM | POA: Diagnosis not present

## 2019-09-24 NOTE — Assessment & Plan Note (Signed)
Treated with as needed Famvir does well

## 2019-09-24 NOTE — Progress Notes (Signed)
There were no vitals taken for this visit.   Subjective:    Patient ID: Jesse Koch, male    DOB: 1956-02-19, 63 y.o.   MRN: [id number]  HPI: IBRAHEM WIECEK is a 63 y.o. 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  No complaints from cholesterol not taking cholesterol medications Has rare fever blisters takes Famvir which helps a great deal has planning and does not need a prescription.  Relevant past medical, surgical, family and social history reviewed and updated as indicated. Interim medical history since our last visit reviewed. Allergies and medications reviewed and updated.  Review of Systems  Constitutional: Negative.   HENT: Negative.   Eyes: Negative.   Respiratory: Negative.   Cardiovascular: Negative.   Gastrointestinal: Negative.   Endocrine: Negative.   Genitourinary: Negative.   Musculoskeletal: Negative.   Skin: Negative.   Allergic/Immunologic: Negative.   Neurological: Negative.   Hematological: Negative.   Psychiatric/Behavioral: Negative.     Per HPI unless specifically indicated above     Objective:    There were no vitals taken for this visit.  Wt Readings from Last 3 Encounters:  11/22/18 143 lb (64.9 kg)  09/11/18 144 lb 3.2 oz (65.4 kg)  07/12/17 149 lb (67.6 kg)    Physical Exam  Results for orders placed or performed in visit on 09/11/18  Comprehensive metabolic panel  Result Value Ref Range   Glucose 81 65 - 99 mg/dL   BUN 9 8 - 27 mg/dL   Creatinine, Ser 0.95 0.76 - 1.27 mg/dL   GFR calc non Af Amer 87 >59 mL/min/1.73   GFR calc Af Amer 100 >59 mL/min/1.73   BUN/Creatinine Ratio 9 (L) 10 - 24   Sodium 140 134 - 144 mmol/L   Potassium 4.2 3.5 - 5.2 mmol/L   Chloride 101 96 - 106 mmol/L   CO2 25 20 - 29 mmol/L   Calcium 8.8 8.6 - 10.2 mg/dL   Total Protein 6.5 6.0 - 8.5 g/dL   Albumin 4.2 3.6 - 4.8 g/dL   Globulin, Total 2.3 1.5 -  4.5 g/dL   Albumin/Globulin Ratio 1.8 1.2 - 2.2   Bilirubin Total 0.4 0.0 - 1.2 mg/dL   Alkaline Phosphatase 82 39 - 117 IU/L   AST 19 0 - 40 IU/L   ALT 22 0 - 44 IU/L  Lipid panel  Result Value Ref Range   Cholesterol, Total 152 100 - 199 mg/dL   Triglycerides 60 0 - 149 mg/dL   HDL 46 >39 mg/dL   VLDL Cholesterol Cal 12 5 - 40 mg/dL   LDL Calculated 94 0 - 99 mg/dL   Chol/HDL Ratio 3.3 0.0 - 5.0 ratio  CBC with Differential/Platelet  Result Value Ref Range   WBC 5.8 3.4 - 10.8 x10E3/uL   RBC 4.64 4.14 - 5.80 x10E6/uL   Hemoglobin 14.6 13.0 - 17.7 g/dL   Hematocrit 42.0 37.5 - 51.0 %   MCV 91 79 - 97 fL   MCH 31.5 26.6 - 33.0 pg   MCHC 34.8 31.5 - 35.7 g/dL   RDW 12.8 12.3 - 15.4 %   Platelets 279 150 - 450 x10E3/uL   Neutrophils 66 Not Estab. %   Lymphs 20 Not Estab. %   Monocytes 8 Not Estab. %   Eos 5 Not Estab. %   Basos 1 Not Estab. %  Neutrophils Absolute 3.8 1.4 - 7.0 x10E3/uL   Lymphocytes Absolute 1.2 0.7 - 3.1 x10E3/uL   Monocytes Absolute 0.5 0.1 - 0.9 x10E3/uL   EOS (ABSOLUTE) 0.3 0.0 - 0.4 x10E3/uL   Basophils Absolute 0.0 0.0 - 0.2 x10E3/uL   Immature Granulocytes 0 Not Estab. %   Immature Grans (Abs) 0.0 0.0 - 0.1 x10E3/uL  TSH  Result Value Ref Range   TSH 2.200 0.450 - 4.500 uIU/mL  Urinalysis, Routine w reflex microscopic  Result Value Ref Range   Specific Gravity, UA 1.015 1.005 - 1.030   pH, UA 7.0 5.0 - 7.5   Color, UA Yellow Yellow   Appearance Ur Clear Clear   Leukocytes, UA Negative Negative   Protein, UA Negative Negative/Trace   Glucose, UA Negative Negative   Ketones, UA Negative Negative   RBC, UA Negative Negative   Bilirubin, UA Negative Negative   Urobilinogen, Ur 1.0 0.2 - 1.0 mg/dL   Nitrite, UA Negative Negative  PSA  Result Value Ref Range   Prostate Specific Ag, Serum 1.1 0.0 - 4.0 ng/mL      Assessment & Plan:   Problem List Items Addressed This Visit      Cardiovascular and Mediastinum   Aortic atherosclerosis  (HCC)    No sx        Digestive   Fever blister    Treated with as needed Famvir does well       Other Visit Diagnoses    PE (physical exam), annual    -  Primary   Relevant Orders   Comprehensive metabolic panel   Lipid panel   CBC with Differential/Platelet   TSH   Urinalysis, Routine w reflex microscopic   PSA      Telemedicine using audio/video telecommunications for a synchronous communication visit. Today's visit due to COVID-19 isolation precautions I connected with and verified that I am speaking with the correct person using two identifiers.   I discussed the limitations, risks, security and privacy concerns of performing an evaluation and management service by telecommunication and the availability of in person appointments. I also discussed with the patient that there may be a patient responsible charge related to this service. The patient expressed understanding and agreed to proceed. The patient's location is home. I am at home.   I discussed the assessment and treatment plan with the patient. The patient was provided an opportunity to ask questions and all were answered. The patient agreed with the plan and demonstrated an understanding of the instructions.   The patient was advised to call back or seek an in-person evaluation if the symptoms worsen or if the condition fails to improve as anticipated.   I provided 21+ minutes of time during this encounter. Follow up plan: Return for Physical Exam.

## 2019-09-24 NOTE — Assessment & Plan Note (Signed)
No sx 

## 2019-09-25 ENCOUNTER — Encounter: Payer: BC Managed Care – PPO | Admitting: Unknown Physician Specialty

## 2019-10-16 ENCOUNTER — Other Ambulatory Visit: Payer: Self-pay

## 2019-10-16 ENCOUNTER — Ambulatory Visit (INDEPENDENT_AMBULATORY_CARE_PROVIDER_SITE_OTHER): Payer: BC Managed Care – PPO | Admitting: Unknown Physician Specialty

## 2019-10-16 ENCOUNTER — Encounter: Payer: Self-pay | Admitting: Unknown Physician Specialty

## 2019-10-16 VITALS — BP 121/72 | HR 70 | Temp 97.9°F | Ht 68.1 in | Wt 146.8 lb

## 2019-10-16 DIAGNOSIS — L920 Granuloma annulare: Secondary | ICD-10-CM | POA: Diagnosis not present

## 2019-10-16 DIAGNOSIS — Z0000 Encounter for general adult medical examination without abnormal findings: Secondary | ICD-10-CM | POA: Diagnosis not present

## 2019-10-16 NOTE — Progress Notes (Signed)
BP 121/72   Pulse 70   Temp 97.9 F (36.6 C) (Oral)   Ht 5' 8.1" (1.73 m)   Wt 146 lb 12.8 oz (66.6 kg)   SpO2 97%   BMI 22.26 kg/m    Subjective:    Patient ID: Jesse Koch, male    DOB: 1956/01/21, 63 y.o.   MRN: [id number]  HPI: Jesse Koch is a 63 y.o. male  Chief Complaint  Patient presents with  . Annual Exam   Pt is here for general history and physical.  No complaints    Past Medical History:  Diagnosis Date  . Wears dentures    partial bottom     Social History   Socioeconomic History  . Marital status: Married    Spouse name: Not on file  . Number of children: Not on file  . Years of education: Not on file  . Highest education level: Not on file  Occupational History  . Not on file  Social Needs  . Financial resource strain: Not on file  . Food insecurity    Worry: Not on file    Inability: Not on file  . Transportation needs    Medical: Not on file    Non-medical: Not on file  Tobacco Use  . Smoking status: Former Smoker    Packs/day: 1.00    Years: 40.00    Pack years: 40.00    Types: Cigarettes    Quit date: 05/18/2010    Years since quitting: 9.4  . Smokeless tobacco: Never Used  . Tobacco comment: smokes occasional small cigar  Substance and Sexual Activity  . Alcohol use: Yes    Alcohol/week: 2.0 standard drinks    Types: 2 Cans of beer per week    Comment: occasional  . Drug use: No  . Sexual activity: Yes  Lifestyle  . Physical activity    Days per week: Not on file    Minutes per session: Not on file  . Stress: Not on file  Relationships  . Social Herbalist on phone: Not on file    Gets together: Not on file    Attends religious service: Not on file    Active member of club or organization: Not on file    Attends meetings of clubs or organizations: Not on file    Relationship status: Not on file  . Intimate partner violence    Fear of current or ex partner: Not on file    Emotionally abused: Not on file   Physically abused: Not on file    Forced sexual activity: Not on file  Other Topics Concern  . Not on file  Social History Narrative  . Not on file   Family History  Problem Relation Age of Onset  . Cancer Mother        lung     Relevant past medical, surgical, family and social history reviewed and updated as indicated. Interim medical history since our last visit reviewed. Allergies and medications reviewed and updated.  Review of Systems  Constitutional: Negative.   HENT: Negative.   Eyes: Negative.   Respiratory: Negative.  Negative for cough, chest tightness and shortness of breath.   Cardiovascular: Negative.   Gastrointestinal: Negative.   Endocrine: Negative.   Genitourinary: Negative.   Musculoskeletal: Negative.   Skin: Negative.        Takes Famcyclovir for fever blisters on occasion  Neurological: Negative.   Psychiatric/Behavioral: Negative.  Per HPI unless specifically indicated above     Objective:    BP 121/72   Pulse 70   Temp 97.9 F (36.6 C) (Oral)   Ht 5' 8.1" (1.73 m)   Wt 146 lb 12.8 oz (66.6 kg)   SpO2 97%   BMI 22.26 kg/m   Wt Readings from Last 3 Encounters:  10/16/19 146 lb 12.8 oz (66.6 kg)  11/22/18 143 lb (64.9 kg)  09/11/18 144 lb 3.2 oz (65.4 kg)    Physical Exam Constitutional:      Appearance: He is well-developed.  HENT:     Head: Normocephalic.     Right Ear: Tympanic membrane, ear canal and external ear normal.     Left Ear: Tympanic membrane, ear canal and external ear normal.     Mouth/Throat:     Pharynx: Uvula midline.  Eyes:     Pupils: Pupils are equal, round, and reactive to light.  Cardiovascular:     Rate and Rhythm: Normal rate and regular rhythm.     Heart sounds: Normal heart sounds. No murmur. No friction rub. No gallop.   Pulmonary:     Effort: Pulmonary effort is normal. No respiratory distress.     Breath sounds: Normal breath sounds.  Abdominal:     General: Bowel sounds are normal. There is  no distension.     Palpations: Abdomen is soft.     Tenderness: There is no abdominal tenderness.  Genitourinary:    Prostate: Normal.  Musculoskeletal: Normal range of motion.  Skin:    General: Skin is warm and dry.     Comments: Tanned.  Chooses not to see a dermatologist  Neurological:     Mental Status: He is alert and oriented to person, place, and time.     Deep Tendon Reflexes: Reflexes are normal and symmetric.  Psychiatric:        Behavior: Behavior normal.        Thought Content: Thought content normal.        Judgment: Judgment normal.     Results for orders placed or performed in visit on 09/11/18  Comprehensive metabolic panel  Result Value Ref Range   Glucose 81 65 - 99 mg/dL   BUN 9 8 - 27 mg/dL   Creatinine, Ser 0.95 0.76 - 1.27 mg/dL   GFR calc non Af Amer 87 >59 mL/min/1.73   GFR calc Af Amer 100 >59 mL/min/1.73   BUN/Creatinine Ratio 9 (L) 10 - 24   Sodium 140 134 - 144 mmol/L   Potassium 4.2 3.5 - 5.2 mmol/L   Chloride 101 96 - 106 mmol/L   CO2 25 20 - 29 mmol/L   Calcium 8.8 8.6 - 10.2 mg/dL   Total Protein 6.5 6.0 - 8.5 g/dL   Albumin 4.2 3.6 - 4.8 g/dL   Globulin, Total 2.3 1.5 - 4.5 g/dL   Albumin/Globulin Ratio 1.8 1.2 - 2.2   Bilirubin Total 0.4 0.0 - 1.2 mg/dL   Alkaline Phosphatase 82 39 - 117 IU/L   AST 19 0 - 40 IU/L   ALT 22 0 - 44 IU/L  Lipid panel  Result Value Ref Range   Cholesterol, Total 152 100 - 199 mg/dL   Triglycerides 60 0 - 149 mg/dL   HDL 46 >39 mg/dL   VLDL Cholesterol Cal 12 5 - 40 mg/dL   LDL Calculated 94 0 - 99 mg/dL   Chol/HDL Ratio 3.3 0.0 - 5.0 ratio  CBC with Differential/Platelet  Result Value  Ref Range   WBC 5.8 3.4 - 10.8 x10E3/uL   RBC 4.64 4.14 - 5.80 x10E6/uL   Hemoglobin 14.6 13.0 - 17.7 g/dL   Hematocrit 42.0 37.5 - 51.0 %   MCV 91 79 - 97 fL   MCH 31.5 26.6 - 33.0 pg   MCHC 34.8 31.5 - 35.7 g/dL   RDW 12.8 12.3 - 15.4 %   Platelets 279 150 - 450 x10E3/uL   Neutrophils 66 Not Estab. %   Lymphs  20 Not Estab. %   Monocytes 8 Not Estab. %   Eos 5 Not Estab. %   Basos 1 Not Estab. %   Neutrophils Absolute 3.8 1.4 - 7.0 x10E3/uL   Lymphocytes Absolute 1.2 0.7 - 3.1 x10E3/uL   Monocytes Absolute 0.5 0.1 - 0.9 x10E3/uL   EOS (ABSOLUTE) 0.3 0.0 - 0.4 x10E3/uL   Basophils Absolute 0.0 0.0 - 0.2 x10E3/uL   Immature Granulocytes 0 Not Estab. %   Immature Grans (Abs) 0.0 0.0 - 0.1 x10E3/uL  TSH  Result Value Ref Range   TSH 2.200 0.450 - 4.500 uIU/mL  Urinalysis, Routine w reflex microscopic  Result Value Ref Range   Specific Gravity, UA 1.015 1.005 - 1.030   pH, UA 7.0 5.0 - 7.5   Color, UA Yellow Yellow   Appearance Ur Clear Clear   Leukocytes, UA Negative Negative   Protein, UA Negative Negative/Trace   Glucose, UA Negative Negative   Ketones, UA Negative Negative   RBC, UA Negative Negative   Bilirubin, UA Negative Negative   Urobilinogen, Ur 1.0 0.2 - 1.0 mg/dL   Nitrite, UA Negative Negative  PSA  Result Value Ref Range   Prostate Specific Ag, Serum 1.1 0.0 - 4.0 ng/mL      Assessment & Plan:   Problem List Items Addressed This Visit      Unprioritized   Granuloma annulare   Relevant Orders   CBC with Differential/Platelet out   Comprehensive metabolic panel   Lipid Panel w/o Chol/HDL Ratio out   TSH   PSA    Other Visit Diagnoses    Routine general medical examination at a health care facility    -  Primary      Refuses: Influenza Tetnus  Colonoscopy due 2024  Follow up plan: Return in about 1 year (around 10/15/2020).

## 2019-10-17 LAB — COMPREHENSIVE METABOLIC PANEL
ALT: 15 IU/L (ref 0–44)
AST: 14 IU/L (ref 0–40)
Albumin/Globulin Ratio: 2.2 (ref 1.2–2.2)
Albumin: 4.4 g/dL (ref 3.8–4.8)
Alkaline Phosphatase: 85 IU/L (ref 39–117)
BUN/Creatinine Ratio: 9 — ABNORMAL LOW (ref 10–24)
BUN: 9 mg/dL (ref 8–27)
Bilirubin Total: 0.4 mg/dL (ref 0.0–1.2)
CO2: 27 mmol/L (ref 20–29)
Calcium: 9.2 mg/dL (ref 8.6–10.2)
Chloride: 100 mmol/L (ref 96–106)
Creatinine, Ser: 0.99 mg/dL (ref 0.76–1.27)
GFR calc Af Amer: 95 mL/min/{1.73_m2} (ref >59)
GFR calc non Af Amer: 82 mL/min/{1.73_m2} (ref >59)
Globulin, Total: 2 g/dL (ref 1.5–4.5)
Glucose: 83 mg/dL (ref 65–99)
Potassium: 4.5 mmol/L (ref 3.5–5.2)
Sodium: 138 mmol/L (ref 134–144)
Total Protein: 6.4 g/dL (ref 6.0–8.5)

## 2019-10-17 LAB — CBC WITH DIFFERENTIAL/PLATELET
Basophils Absolute: 0 10*3/uL (ref 0.0–0.2)
Basos: 1 %
EOS (ABSOLUTE): 0.2 10*3/uL (ref 0.0–0.4)
Eos: 2 %
Hematocrit: 43.7 % (ref 37.5–51.0)
Hemoglobin: 15.2 g/dL (ref 13.0–17.7)
Immature Grans (Abs): 0 10*3/uL (ref 0.0–0.1)
Immature Granulocytes: 0 %
Lymphocytes Absolute: 1.4 10*3/uL (ref 0.7–3.1)
Lymphs: 20 %
MCH: 32.4 pg (ref 26.6–33.0)
MCHC: 34.8 g/dL (ref 31.5–35.7)
MCV: 93 fL (ref 79–97)
Monocytes Absolute: 0.6 10*3/uL (ref 0.1–0.9)
Monocytes: 8 %
Neutrophils Absolute: 4.9 10*3/uL (ref 1.4–7.0)
Neutrophils: 69 %
Platelets: 278 10*3/uL (ref 150–450)
RBC: 4.69 x10E6/uL (ref 4.14–5.80)
RDW: 12.6 % (ref 11.6–15.4)
WBC: 7.1 10*3/uL (ref 3.4–10.8)

## 2019-10-17 LAB — PSA: Prostate Specific Ag, Serum: 1.1 ng/mL (ref 0.0–4.0)

## 2019-10-17 LAB — LIPID PANEL W/O CHOL/HDL RATIO
Cholesterol, Total: 157 mg/dL (ref 100–199)
HDL: 50 mg/dL (ref >39)
LDL Chol Calc (NIH): 94 mg/dL (ref 0–99)
Triglycerides: 67 mg/dL (ref 0–149)
VLDL Cholesterol Cal: 13 mg/dL (ref 5–40)

## 2019-10-17 LAB — TSH: TSH: 2.04 u[IU]/mL (ref 0.450–4.500)

## 2019-10-21 ENCOUNTER — Encounter: Payer: Self-pay | Admitting: Unknown Physician Specialty

## 2019-10-22 ENCOUNTER — Telehealth: Payer: Self-pay | Admitting: *Deleted

## 2019-10-22 DIAGNOSIS — Z87891 Personal history of nicotine dependence: Secondary | ICD-10-CM

## 2019-10-22 DIAGNOSIS — Z122 Encounter for screening for malignant neoplasm of respiratory organs: Secondary | ICD-10-CM

## 2019-10-22 NOTE — Telephone Encounter (Signed)
Received referral for initial lung cancer screening scan. Contacted patient and obtained smoking history,(former, quit 2011, 40 pack year) as well as answering questions related to screening process. Patient denies signs of lung cancer such as weight loss or hemoptysis. Patient denies comorbidity that would prevent curative treatment if lung cancer were found. Patient is scheduled for shared decision making visit and CT scan on 11/05/19 at 11am.

## 2019-11-05 ENCOUNTER — Other Ambulatory Visit: Payer: Self-pay

## 2019-11-05 ENCOUNTER — Ambulatory Visit
Admission: RE | Admit: 2019-11-05 | Discharge: 2019-11-05 | Disposition: A | Discharge status: Home / Self Care | Payer: BC Managed Care – PPO | Source: Ambulatory Visit | Attending: Oncology | Admitting: Oncology

## 2019-11-05 ENCOUNTER — Inpatient Hospital Stay: Payer: BC Managed Care – PPO | Attending: Oncology | Admitting: Oncology

## 2019-11-05 DIAGNOSIS — Z122 Encounter for screening for malignant neoplasm of respiratory organs: Secondary | ICD-10-CM | POA: Insufficient documentation

## 2019-11-05 DIAGNOSIS — Z87891 Personal history of nicotine dependence: Secondary | ICD-10-CM | POA: Insufficient documentation

## 2019-11-05 NOTE — Progress Notes (Signed)
Virtual Visit via Video Note  I connected with Jesse Koch  on 11/05/19 at 11:00 AM EST by a video enabled telemedicine application and verified that I am speaking with the correct person using two identifiers.  Location: Patient: opic Provider: office   I discussed the limitations of evaluation and management by telemedicine and the availability of in person appointments. The patient expressed understanding and agreed to proceed.  I discussed the assessment and treatment plan with the patient. The patient was provided an opportunity to ask questions and all were answered. The patient agreed with the plan and demonstrated an understanding of the instructions.   The patient was advised to call back or seek an in-person evaluation if the symptoms worsen or if the condition fails to improve as anticipated.   In accordance with CMS guidelines, patient has met eligibility criteria including age, absence of signs or symptoms of lung cancer.  Social History   Tobacco Use  . Smoking status: Former Smoker    Packs/day: 1.00    Years: 40.00    Pack years: 40.00    Types: Cigarettes    Quit date: 05/18/2010    Years since quitting: 9.4  . Smokeless tobacco: Never Used  . Tobacco comment: smokes occasional small cigar  Substance Use Topics  . Alcohol use: Yes    Alcohol/week: 2.0 standard drinks    Types: 2 Cans of beer per week    Comment: occasional  . Drug use: No      A shared decision-making session was conducted prior to the performance of CT scan. This includes one or more decision aids, includes benefits and harms of screening, follow-up diagnostic testing, over-diagnosis, false positive rate, and total radiation exposure.   Counseling on the importance of adherence to annual lung cancer LDCT screening, impact of co-morbidities, and ability or willingness to undergo diagnosis and treatment is imperative for compliance of the program.   Counseling on the importance of continued smoking  cessation for former smokers; the importance of smoking cessation for current smokers, and information about tobacco cessation interventions have been given to patient including [place] and [place] [facility] [place] programs.   Written order for lung cancer screening with LDCT has been given to the patient and any and all questions have been answered to the best of my abilities.    Yearly follow up will be coordinated by Burgess Estelle, Thoracic Navigator.  I provided 15 minutes of face-to-face video visit time during this encounter, and > 50% was spent counseling as documented under my assessment & plan.   [name], NP

## 2019-11-06 ENCOUNTER — Telehealth: Payer: Self-pay | Admitting: *Deleted

## 2019-11-06 NOTE — Telephone Encounter (Signed)
Notified patient of LDCT lung cancer screening program results with recommendation for 6 month follow up imaging. Also notified of incidental findings noted below and is encouraged to discuss further with PCP who will receive a copy of this note and/or the CT report. Patient verbalizes understanding.   IMPRESSION: Lung-RADS 3, probably benign findings. Short-term follow-up in 6 months is recommended with repeat low-dose chest CT without contrast (please use the following order, "CT CHEST LCS NODULE FOLLOW-UP W/O CM").  7.3 mm subpleural nodule in the right middle lobe adjacent to the minor fissure.  Aortic Atherosclerosis (ICD10-I70.0) and Emphysema (ICD10-J43.9).

## 2020-04-29 ENCOUNTER — Telehealth: Payer: Self-pay

## 2020-04-29 DIAGNOSIS — Z87891 Personal history of nicotine dependence: Secondary | ICD-10-CM

## 2020-04-29 DIAGNOSIS — R918 Other nonspecific abnormal finding of lung field: Secondary | ICD-10-CM

## 2020-04-29 NOTE — Telephone Encounter (Signed)
Patient has been notified that the low dose lung cancer screening CT scan is due currently or will be in near future.  Confirmed that patient is within the appropriate age range and asymptomatic, (no signs or symptoms of lung cancer).  Patient denies illness that would prevent curative treatment for lung cancer if found.  Patient is agreeable for CT scan being scheduled.    Verified smoking history (former smoker, quit 04/2010 with 40 1 ppd year history).    CT scheduled for 05/11/20 @ 11:25

## 2020-05-03 NOTE — Telephone Encounter (Signed)
Smoking history: former, quit 04/2010, 40 pack year

## 2020-05-03 NOTE — Addendum Note (Signed)
Addended by: Lieutenant Diego on: 05/03/2020 11:46 AM   Modules accepted: Orders

## 2020-05-12 ENCOUNTER — Other Ambulatory Visit: Payer: Self-pay

## 2020-05-12 ENCOUNTER — Ambulatory Visit
Admission: RE | Admit: 2020-05-12 | Discharge: 2020-05-12 | Disposition: A | Discharge status: Home / Self Care | Payer: BC Managed Care – PPO | Source: Ambulatory Visit | Attending: Oncology | Admitting: Oncology

## 2020-05-12 DIAGNOSIS — Z87891 Personal history of nicotine dependence: Secondary | ICD-10-CM | POA: Diagnosis present

## 2020-05-12 DIAGNOSIS — R918 Other nonspecific abnormal finding of lung field: Secondary | ICD-10-CM | POA: Insufficient documentation

## 2020-05-17 ENCOUNTER — Encounter: Payer: Self-pay | Admitting: *Deleted

## 2020-07-05 ENCOUNTER — Encounter: Payer: Self-pay | Admitting: Family Medicine

## 2020-10-29 ENCOUNTER — Encounter: Payer: BC Managed Care – PPO | Admitting: Family Medicine

## 2020-10-31 ENCOUNTER — Encounter: Payer: Self-pay | Admitting: Nurse Practitioner

## 2020-10-31 DIAGNOSIS — J432 Centrilobular emphysema: Secondary | ICD-10-CM | POA: Insufficient documentation

## 2020-11-01 ENCOUNTER — Encounter: Payer: BC Managed Care – PPO | Admitting: Nurse Practitioner

## 2020-11-23 ENCOUNTER — Encounter: Payer: Self-pay | Admitting: Nurse Practitioner

## 2020-11-23 ENCOUNTER — Ambulatory Visit (INDEPENDENT_AMBULATORY_CARE_PROVIDER_SITE_OTHER): Payer: BC Managed Care – PPO | Admitting: Nurse Practitioner

## 2020-11-23 ENCOUNTER — Other Ambulatory Visit: Payer: Self-pay

## 2020-11-23 VITALS — BP 120/74 | HR 69 | Temp 98.2°F | Ht 66.97 in | Wt 138.2 lb

## 2020-11-23 DIAGNOSIS — Z13220 Encounter for screening for lipoid disorders: Secondary | ICD-10-CM

## 2020-11-23 DIAGNOSIS — Z0000 Encounter for general adult medical examination without abnormal findings: Secondary | ICD-10-CM

## 2020-11-23 DIAGNOSIS — Z125 Encounter for screening for malignant neoplasm of prostate: Secondary | ICD-10-CM

## 2020-11-23 DIAGNOSIS — Z23 Encounter for immunization: Secondary | ICD-10-CM | POA: Diagnosis not present

## 2020-11-23 DIAGNOSIS — Z1329 Encounter for screening for other suspected endocrine disorder: Secondary | ICD-10-CM | POA: Diagnosis not present

## 2020-11-23 NOTE — Patient Instructions (Signed)
Premiere protein shakes --- drink about 3 times a day   Healthy Eating Following a healthy eating pattern may help you to achieve and maintain a healthy body weight, reduce the risk of chronic disease, and live a long and productive life. It is important to follow a healthy eating pattern at an appropriate calorie level for your body. Your nutritional needs should be met primarily through food by choosing a variety of nutrient-rich foods. What are tips for following this plan? Reading food labels  Read labels and choose the following: ? Reduced or low sodium. ? Juices with 100% fruit juice. ? Foods with low saturated fats and high polyunsaturated and monounsaturated fats. ? Foods with whole grains, such as whole wheat, cracked wheat, brown rice, and wild rice. ? Whole grains that are fortified with folic acid. This is recommended for women who are pregnant or who want to become pregnant.  Read labels and avoid the following: ? Foods with a lot of added sugars. These include foods that contain brown sugar, corn sweetener, corn syrup, dextrose, fructose, glucose, high-fructose corn syrup, honey, invert sugar, lactose, malt syrup, maltose, molasses, raw sugar, sucrose, trehalose, or turbinado sugar.  Do not eat more than the following amounts of added sugar per day:  6 teaspoons (25 g) for women.  9 teaspoons (38 g) for men. ? Foods that contain processed or refined starches and grains. ? Refined grain products, such as white flour, degermed cornmeal, white bread, and white rice. Shopping  Choose nutrient-rich snacks, such as vegetables, whole fruits, and nuts. Avoid high-calorie and high-sugar snacks, such as potato chips, fruit snacks, and candy.  Use oil-based dressings and spreads on foods instead of solid fats such as butter, stick margarine, or cream cheese.  Limit pre-made sauces, mixes, and "instant" products such as flavored rice, instant noodles, and ready-made pasta.  Try  more plant-protein sources, such as tofu, tempeh, black beans, edamame, lentils, nuts, and seeds.  Explore eating plans such as the Mediterranean diet or vegetarian diet. Cooking  Use oil to saut or stir-fry foods instead of solid fats such as butter, stick margarine, or lard.  Try baking, boiling, grilling, or broiling instead of frying.  Remove the fatty part of meats before cooking.  Steam vegetables in water or broth. Meal planning   At meals, imagine dividing your plate into fourths: ? One-half of your plate is fruits and vegetables. ? One-fourth of your plate is whole grains. ? One-fourth of your plate is protein, especially lean meats, poultry, eggs, tofu, beans, or nuts.  Include low-fat dairy as part of your daily diet. Lifestyle  Choose healthy options in all settings, including home, work, school, restaurants, or stores.  Prepare your food safely: ? Wash your hands after handling raw meats. ? Keep food preparation surfaces clean by regularly washing with hot, soapy water. ? Keep raw meats separate from ready-to-eat foods, such as fruits and vegetables. ? Cook seafood, meat, poultry, and eggs to the recommended internal temperature. ? Store foods at safe temperatures. In general:  Keep cold foods at 66F (4.4C) or below.  Keep hot foods at 166F (60C) or above.  Keep your freezer at [facility] (-17.8C) or below.  Foods are no longer safe to eat when they have been between the temperatures of 40-166F (4.4-60C) for more than 2 hours. What foods should I eat? Fruits Aim to eat 2 cup-equivalents of fresh, canned (in natural juice), or frozen fruits each day. Examples of 1 cup-equivalent of fruit include  1 small apple, 8 large strawberries, 1 cup canned fruit,  cup dried fruit, or 1 cup 100% juice. Vegetables Aim to eat 2-3 cup-equivalents of fresh and frozen vegetables each day, including different varieties and colors. Examples of 1 cup-equivalent of vegetables  include 2 medium carrots, 2 cups raw, leafy greens, 1 cup chopped vegetable (raw or cooked), or 1 medium baked potato. Grains Aim to eat 6 ounce-equivalents of whole grains each day. Examples of 1 ounce-equivalent of grains include 1 slice of bread, 1 cup ready-to-eat cereal, 3 cups popcorn, or  cup cooked rice, pasta, or cereal. Meats and other proteins Aim to eat 5-6 ounce-equivalents of protein each day. Examples of 1 ounce-equivalent of protein include 1 egg, 1/2 cup nuts or seeds, or 1 tablespoon (16 g) peanut butter. A cut of meat or fish that is the size of a deck of cards is about 3-4 ounce-equivalents.  Of the protein you eat each week, try to have at least 8 ounces come from seafood. This includes salmon, trout, herring, and anchovies. Dairy Aim to eat 3 cup-equivalents of fat-free or low-fat dairy each day. Examples of 1 cup-equivalent of dairy include 1 cup (240 mL) milk, 8 ounces (250 g) yogurt, 1 ounces (44 g) natural cheese, or 1 cup (240 mL) fortified soy milk. Fats and oils  Aim for about 5 teaspoons (21 g) per day. Choose monounsaturated fats, such as canola and olive oils, avocados, peanut butter, and most nuts, or polyunsaturated fats, such as sunflower, corn, and soybean oils, walnuts, pine nuts, sesame seeds, sunflower seeds, and flaxseed. Beverages  Aim for six 8-oz glasses of water per day. Limit coffee to three to five 8-oz cups per day.  Limit caffeinated beverages that have added calories, such as soda and energy drinks.  Limit alcohol intake to no more than 1 drink a day for nonpregnant women and 2 drinks a day for men. One drink equals 12 oz of beer (355 mL), 5 oz of wine (148 mL), or 1 oz of hard liquor (44 mL). Seasoning and other foods  Avoid adding excess amounts of salt to your foods. Try flavoring foods with herbs and spices instead of salt.  Avoid adding sugar to foods.  Try using oil-based dressings, sauces, and spreads instead of solid fats. This  information is based on general [place] nutrition guidelines. For more information, visit [employer]. Exact amounts may vary based on your nutrition needs. Summary  A healthy eating plan may help you to maintain a healthy weight, reduce the risk of chronic diseases, and stay active throughout your life.  Plan your meals. Make sure you eat the right portions of a variety of nutrient-rich foods.  Try baking, boiling, grilling, or broiling instead of frying.  Choose healthy options in all settings, including home, work, school, restaurants, or stores. This information is not intended to replace advice given to you by your health care provider. Make sure you discuss any questions you have with your health care provider. Document Revised: 02/25/2018 Document Reviewed: 02/25/2018 Elsevier Patient Education  [place].

## 2020-11-23 NOTE — Progress Notes (Signed)
BP 120/74   Pulse 69   Temp 98.2 F (36.8 C) (Oral)   Ht 5' 6.97" (1.701 m)   Wt 138 lb 3.2 oz (62.7 kg)   SpO2 98%   BMI 21.67 kg/m    Subjective:    Patient ID: Jesse Koch, male    DOB: 1956/09/01, 64 y.o.   MRN: [id number]  HPI: Jesse Koch is a 64 y.o. male presenting on 11/23/2020 for comprehensive medical examination. Current medical complaints include:none  He currently lives with: wife  Interim Problems from his last visit: no  Functional Status Survey: Is the patient deaf or have difficulty hearing?: No Does the patient have difficulty seeing, even when wearing glasses/contacts?: No Does the patient have difficulty concentrating, remembering, or making decisions?: No Does the patient have difficulty walking or climbing stairs?: No Does the patient have difficulty dressing or bathing?: No Does the patient have difficulty doing errands alone such as visiting a doctor's office or shopping?: No  FALL RISK: Fall Risk  11/23/2020 10/16/2019 09/11/2018 07/12/2017 07/11/2016  Falls in the past year? 0 0 No No No  Number falls in past yr: - 0 - - -  Injury with Fall? - 0 - - -  Follow up - Falls evaluation completed - - -    Depression Screen Depression screen [facility] 2/9 11/23/2020 10/16/2019 09/11/2018 07/12/2017 07/11/2016  Decreased Interest 0 0 0 0 0  Down, Depressed, Hopeless 0 0 0 0 0  PHQ - 2 Score 0 0 0 0 0  Altered sleeping - - 0 - -  Tired, decreased energy - - 0 - -  Change in appetite - - 0 - -  Feeling bad or failure about yourself  - - 0 - -  Trouble concentrating - - 0 - -  Moving slowly or fidgety/restless - - 0 - -  Suicidal thoughts - - 0 - -  PHQ-9 Score - - 0 - -    Advanced Directives <no information>  Past Medical History:  Past Medical History:  Diagnosis Date  . Wears dentures    partial bottom    Surgical History:  Past Surgical History:  Procedure Laterality Date  . COLONOSCOPY    . COLONOSCOPY WITH PROPOFOL N/A 11/22/2018    Procedure: COLONOSCOPY WITH PROPOFOL;  Surgeon: Lucilla Lame, MD;  Location: [place];  Service: Endoscopy;  Laterality: N/A;  . POLYPECTOMY  11/22/2018   Procedure: POLYPECTOMY INTESTINAL;  Surgeon: Lucilla Lame, MD;  Location: [place];  Service: Endoscopy;;    Medications:  No current outpatient medications on file prior to visit.   No current facility-administered medications on file prior to visit.    Allergies:  Allergies  Allergen Reactions  . Oxycodone Nausea Only  . Penicillins Itching    Social History:  Social History   Socioeconomic History  . Marital status: Married    Spouse name: Not on file  . Number of children: Not on file  . Years of education: Not on file  . Highest education level: Not on file  Occupational History  . Not on file  Tobacco Use  . Smoking status: Former Smoker    Packs/day: 1.00    Years: 40.00    Pack years: 40.00    Types: Cigarettes    Quit date: 05/18/2010    Years since quitting: 10.5  . Smokeless tobacco: Never Used  . Tobacco comment: smokes occasional small cigar  Vaping Use  . Vaping Use: Every day  .  Substances: Nicotine  . Devices: Black Note  Substance and Sexual Activity  . Alcohol use: Yes    Alcohol/week: 2.0 standard drinks    Types: 2 Cans of beer per week    Comment: occasional  . Drug use: No  . Sexual activity: Yes  Other Topics Concern  . Not on file  Social History Narrative  . Not on file   Social Determinants of Health   Financial Resource Strain: Not on file  Food Insecurity: Not on file  Transportation Needs: Not on file  Physical Activity: Not on file  Stress: Not on file  Social Connections: Not on file  Intimate Partner Violence: Not on file   Social History   Tobacco Use  Smoking Status Former Smoker  . Packs/day: 1.00  . Years: 40.00  . Pack years: 40.00  . Types: Cigarettes  . Quit date: 05/18/2010  . Years since quitting: 10.5  Smokeless Tobacco Never  Used  Tobacco Comment   smokes occasional small cigar   Social History   Substance and Sexual Activity  Alcohol Use Yes  . Alcohol/week: 2.0 standard drinks  . Types: 2 Cans of beer per week   Comment: occasional    Family History:  Family History  Problem Relation Age of Onset  . Cancer Mother        lung    Past medical history, surgical history, medications, allergies, family history and social history reviewed with patient today and changes made to appropriate areas of the chart.   Review of Systems -negative All other ROS negative except what is listed above and in the HPI.      Objective:    BP 120/74   Pulse 69   Temp 98.2 F (36.8 C) (Oral)   Ht 5' 6.97" (1.701 m)   Wt 138 lb 3.2 oz (62.7 kg)   SpO2 98%   BMI 21.67 kg/m   Wt Readings from Last 3 Encounters:  11/23/20 138 lb 3.2 oz (62.7 kg)  11/05/19 148 lb (67.1 kg)  10/16/19 146 lb 12.8 oz (66.6 kg)    Physical Exam Vitals and nursing note reviewed.  Constitutional:      General: He is awake. He is not in acute distress.    Appearance: He is well-developed and well-groomed. He is not ill-appearing.  HENT:     Head: Normocephalic and atraumatic.     Right Ear: Hearing, tympanic membrane, ear canal and external ear normal. No drainage.     Left Ear: Hearing, tympanic membrane, ear canal and external ear normal. No drainage.     Nose: Nose normal.     Mouth/Throat:     Pharynx: Uvula midline.  Eyes:     General: Lids are normal.        Right eye: No discharge.        Left eye: No discharge.     Extraocular Movements: Extraocular movements intact.     Conjunctiva/sclera: Conjunctivae normal.     Pupils: Pupils are equal, round, and reactive to light.     Visual Fields: Right eye visual fields normal and left eye visual fields normal.  Neck:     Thyroid: No thyromegaly.     Vascular: No carotid bruit or JVD.     Trachea: Trachea normal.  Cardiovascular:     Rate and Rhythm: Normal rate and  regular rhythm.     Heart sounds: Normal heart sounds, S1 normal and S2 normal. No murmur heard. No gallop.  Pulmonary:     Effort: Pulmonary effort is normal. No accessory muscle usage or respiratory distress.     Breath sounds: Normal breath sounds.  Abdominal:     General: Bowel sounds are normal.     Palpations: Abdomen is soft. There is no hepatomegaly or splenomegaly.     Tenderness: There is no abdominal tenderness.  Musculoskeletal:        General: Normal range of motion.     Cervical back: Normal range of motion and neck supple.     Right lower leg: No edema.     Left lower leg: No edema.  Lymphadenopathy:     Head:     Right side of head: No submental, submandibular, tonsillar, preauricular or posterior auricular adenopathy.     Left side of head: No submental, submandibular, tonsillar, preauricular or posterior auricular adenopathy.     Cervical: No cervical adenopathy.  Skin:    General: Skin is warm and dry.     Capillary Refill: Capillary refill takes less than 2 seconds.     Findings: No rash.  Neurological:     Mental Status: He is alert and oriented to person, place, and time.     Cranial Nerves: Cranial nerves are intact.     Gait: Gait is intact.     Deep Tendon Reflexes: Reflexes are normal and symmetric.     Reflex Scores:      Brachioradialis reflexes are 2+ on the right side and 2+ on the left side.      Patellar reflexes are 2+ on the right side and 2+ on the left side. Psychiatric:        Attention and Perception: Attention normal.        Mood and Affect: Mood normal.        Speech: Speech normal.        Behavior: Behavior normal. Behavior is cooperative.        Thought Content: Thought content normal.        Cognition and Memory: Cognition normal.        Judgment: Judgment normal.    Results for orders placed or performed in visit on 10/16/19  CBC with Differential/Platelet out  Result Value Ref Range   WBC 7.1 3.4 - 10.8 x10E3/uL   RBC 4.69  4.14 - 5.80 x10E6/uL   Hemoglobin 15.2 13.0 - 17.7 g/dL   Hematocrit 83.6 62.9 - 51.0 %   MCV 93 79 - 97 fL   MCH 32.4 26.6 - 33.0 pg   MCHC 34.8 31.5 - 35.7 g/dL   RDW 47.6 54.6 - 50.3 %   Platelets 278 150 - 450 x10E3/uL   Neutrophils 69 Not Estab. %   Lymphs 20 Not Estab. %   Monocytes 8 Not Estab. %   Eos 2 Not Estab. %   Basos 1 Not Estab. %   Neutrophils Absolute 4.9 1.4 - 7.0 x10E3/uL   Lymphocytes Absolute 1.4 0.7 - 3.1 x10E3/uL   Monocytes Absolute 0.6 0.1 - 0.9 x10E3/uL   EOS (ABSOLUTE) 0.2 0.0 - 0.4 x10E3/uL   Basophils Absolute 0.0 0.0 - 0.2 x10E3/uL   Immature Granulocytes 0 Not Estab. %   Immature Grans (Abs) 0.0 0.0 - 0.1 x10E3/uL  Comprehensive metabolic panel  Result Value Ref Range   Glucose 83 65 - 99 mg/dL   BUN 9 8 - 27 mg/dL   Creatinine, Ser 5.46 0.76 - 1.27 mg/dL   GFR calc non Af Amer 82 >59 mL/min/1.73  GFR calc Af Amer 95 >59 mL/min/1.73   BUN/Creatinine Ratio 9 (L) 10 - 24   Sodium 138 134 - 144 mmol/L   Potassium 4.5 3.5 - 5.2 mmol/L   Chloride 100 96 - 106 mmol/L   CO2 27 20 - 29 mmol/L   Calcium 9.2 8.6 - 10.2 mg/dL   Total Protein 6.4 6.0 - 8.5 g/dL   Albumin 4.4 3.8 - 4.8 g/dL   Globulin, Total 2.0 1.5 - 4.5 g/dL   Albumin/Globulin Ratio 2.2 1.2 - 2.2   Bilirubin Total 0.4 0.0 - 1.2 mg/dL   Alkaline Phosphatase 85 39 - 117 IU/L   AST 14 0 - 40 IU/L   ALT 15 0 - 44 IU/L  Lipid Panel w/o Chol/HDL Ratio out  Result Value Ref Range   Cholesterol, Total 157 100 - 199 mg/dL   Triglycerides 67 0 - 149 mg/dL   HDL 50 >50 mg/dL   VLDL Cholesterol Cal 13 5 - 40 mg/dL   LDL Chol Calc (NIH) 94 0 - 99 mg/dL  TSH  Result Value Ref Range   TSH 2.040 0.450 - 4.500 uIU/mL  PSA  Result Value Ref Range   Prostate Specific Ag, Serum 1.1 0.0 - 4.0 ng/mL      Assessment & Plan:   Problem List Items Addressed This Visit   None   Visit Diagnoses    Encounter for annual physical exam    -  Primary   Annual labs today to include CBC, CMP, TSH,  lipid, PSA   Relevant Orders   CBC with Differential/Platelet   Comprehensive metabolic panel   Prostate cancer screening       PSA on labs today   Relevant Orders   PSA   Thyroid disorder screen       TSH on labs today   Relevant Orders   TSH   Screening cholesterol level       Lipid panel on labs today   Relevant Orders   Lipid Panel w/o Chol/HDL Ratio      Discussed aspirin prophylaxis for myocardial infarction prevention and decision was that we recommended ASA, and patient refused  LABORATORY TESTING:  Health maintenance labs ordered today as discussed above.   The natural history of prostate cancer and ongoing controversy regarding screening and potential treatment outcomes of prostate cancer has been discussed with the patient. The meaning of a false positive PSA and a false negative PSA has been discussed. He indicates understanding of the limitations of this screening test and wishes to proceed with screening PSA testing.   IMMUNIZATIONS:   - Tdap: Tetanus vaccination status reviewed: Td vaccination indicated and given today. - Influenza: Refused - Pneumovax: Refused - Prevnar: Refused - Zostavax vaccine: Refused  SCREENING: - Colonoscopy: Up to date  Discussed with patient purpose of the colonoscopy is to detect colon cancer at curable precancerous or early stages   - AAA Screening: Not applicable  -Hearing Test: Not applicable  -Spirometry: Refused   PATIENT COUNSELING:    Sexuality: Discussed sexually transmitted diseases, partner selection, use of condoms, avoidance of unintended pregnancy  and contraceptive alternatives.   Advised to avoid cigarette smoking.  I discussed with the patient that most people either abstain from alcohol or drink within safe limits (<=14/week and <=4 drinks/occasion for males, <=7/weeks and <= 3 drinks/occasion for females) and that the risk for alcohol disorders and other health effects rises proportionally with the number of  drinks per week and how often a  drinker exceeds daily limits.  Discussed cessation/primary prevention of drug use and availability of treatment for abuse.   Diet: Encouraged to adjust caloric intake to maintain  or achieve ideal body weight, to reduce intake of dietary saturated fat and total fat, to limit sodium intake by avoiding high sodium foods and not adding table salt, and to maintain adequate dietary potassium and calcium preferably from fresh fruits, vegetables, and low-fat dairy products.    Stressed the importance of regular exercise  Injury prevention: Discussed safety belts, safety helmets, smoke detector, smoking near bedding or upholstery.   Dental health: Discussed importance of regular tooth brushing, flossing, and dental visits.   Follow up plan: NEXT PREVENTATIVE PHYSICAL DUE IN 1 YEAR. Return in about 1 year (around 11/23/2021) for Annual physical to meet new PCP.

## 2020-11-24 LAB — COMPREHENSIVE METABOLIC PANEL
ALT: 16 IU/L (ref 0–44)
AST: 17 IU/L (ref 0–40)
Albumin/Globulin Ratio: 2.1 (ref 1.2–2.2)
Albumin: 4.5 g/dL (ref 3.8–4.8)
Alkaline Phosphatase: 85 IU/L (ref 44–121)
BUN/Creatinine Ratio: 14 (ref 10–24)
BUN: 13 mg/dL (ref 8–27)
Bilirubin Total: 0.3 mg/dL (ref 0.0–1.2)
CO2: 22 mmol/L (ref 20–29)
Calcium: 9.1 mg/dL (ref 8.6–10.2)
Chloride: 101 mmol/L (ref 96–106)
Creatinine, Ser: 0.91 mg/dL (ref 0.76–1.27)
GFR calc Af Amer: 104 mL/min/{1.73_m2} (ref >59)
GFR calc non Af Amer: 90 mL/min/{1.73_m2} (ref >59)
Globulin, Total: 2.1 g/dL (ref 1.5–4.5)
Glucose: 82 mg/dL (ref 65–99)
Potassium: 4.2 mmol/L (ref 3.5–5.2)
Sodium: 138 mmol/L (ref 134–144)
Total Protein: 6.6 g/dL (ref 6.0–8.5)

## 2020-11-24 LAB — LIPID PANEL W/O CHOL/HDL RATIO
Cholesterol, Total: 173 mg/dL (ref 100–199)
HDL: 48 mg/dL (ref >39)
LDL Chol Calc (NIH): 108 mg/dL — ABNORMAL HIGH (ref 0–99)
Triglycerides: 95 mg/dL (ref 0–149)
VLDL Cholesterol Cal: 17 mg/dL (ref 5–40)

## 2020-11-24 LAB — CBC WITH DIFFERENTIAL/PLATELET
Basophils Absolute: 0.1 10*3/uL (ref 0.0–0.2)
Basos: 1 %
EOS (ABSOLUTE): 0.2 10*3/uL (ref 0.0–0.4)
Eos: 3 %
Hematocrit: 42.7 % (ref 37.5–51.0)
Hemoglobin: 14.9 g/dL (ref 13.0–17.7)
Immature Grans (Abs): 0 10*3/uL (ref 0.0–0.1)
Immature Granulocytes: 0 %
Lymphocytes Absolute: 1.5 10*3/uL (ref 0.7–3.1)
Lymphs: 19 %
MCH: 32.3 pg (ref 26.6–33.0)
MCHC: 34.9 g/dL (ref 31.5–35.7)
MCV: 93 fL (ref 79–97)
Monocytes Absolute: 0.6 10*3/uL (ref 0.1–0.9)
Monocytes: 8 %
Neutrophils Absolute: 5.6 10*3/uL (ref 1.4–7.0)
Neutrophils: 69 %
Platelets: 290 10*3/uL (ref 150–450)
RBC: 4.61 x10E6/uL (ref 4.14–5.80)
RDW: 12.9 % (ref 11.6–15.4)
WBC: 8 10*3/uL (ref 3.4–10.8)

## 2020-11-24 LAB — PSA: Prostate Specific Ag, Serum: 1.1 ng/mL (ref 0.0–4.0)

## 2020-11-24 LAB — TSH: TSH: 1.25 u[IU]/mL (ref 0.450–4.500)

## 2020-11-24 NOTE — Progress Notes (Signed)
Please let Mr. Reihl know his labs have returned and overall continue to be within normal ranges with exception of LDL, bad cholesterol, which continues to be mildly elevated.  However, continued recommendation to focus on diet and exercise regularly.  Will recheck next visit.  Have a Happy New Year. Keep being awesome!!  Thank you for allowing me to participate in your care. Kindest regards, Shawn Dannenberg

## 2021-05-11 ENCOUNTER — Telehealth: Payer: Self-pay

## 2021-05-11 NOTE — Telephone Encounter (Signed)
Left message for patient to notify them that it is time to schedule annual low dose lung cancer screening CT scan. Instructed patient to call back ([phone]) to verify information and schedule.  

## 2021-12-07 ENCOUNTER — Other Ambulatory Visit: Payer: Self-pay | Admitting: *Deleted

## 2021-12-07 DIAGNOSIS — Z87891 Personal history of nicotine dependence: Secondary | ICD-10-CM

## 2021-12-13 ENCOUNTER — Ambulatory Visit
Admission: RE | Admit: 2021-12-13 | Discharge: 2021-12-13 | Disposition: A | Discharge status: Home / Self Care | Payer: BC Managed Care – PPO | Source: Ambulatory Visit | Attending: Acute Care | Admitting: Acute Care

## 2021-12-13 ENCOUNTER — Other Ambulatory Visit: Payer: Self-pay

## 2021-12-13 DIAGNOSIS — Z87891 Personal history of nicotine dependence: Secondary | ICD-10-CM | POA: Diagnosis present

## 2021-12-13 IMAGING — CT CT CHEST LUNG CANCER SCREENING LOW DOSE W/O CM
2 of 5 series · 15 of 40 positions shown, 18 images · non-contrast
Comparison: Multiple priors, most recent [DATE]

CLINICAL DATA: Former smoker with 40 pack-year history



[Series 3: lung 1.00 · axial · 0.61mm/px · z∈[-1245,-914]mm · 12 of 365 slices shown, 15 images]
[im 17/365  mediastinal]
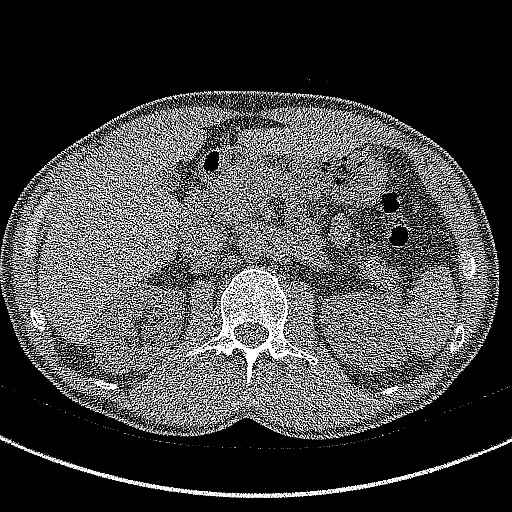
[im 17/365  lung]
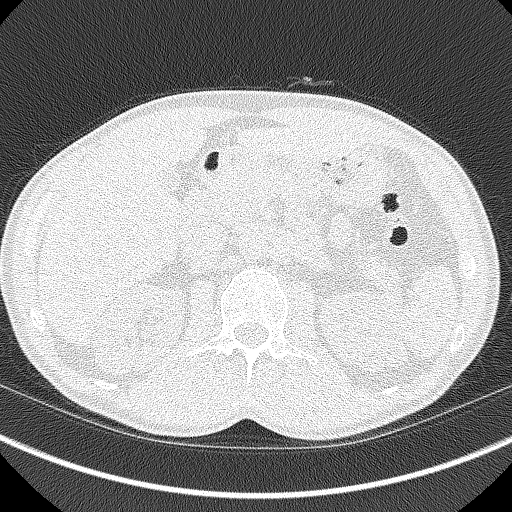
[im 50/365  lung]
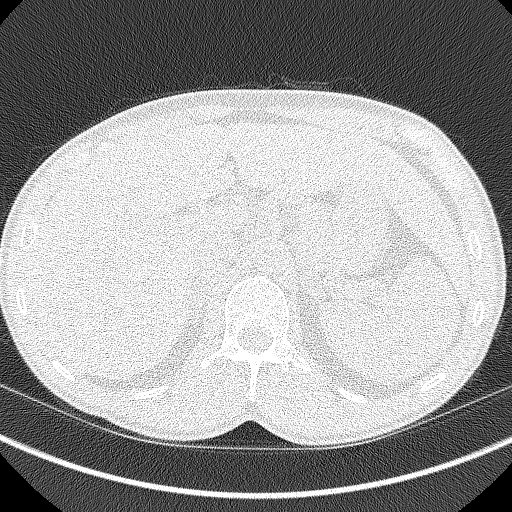
[im 83/365  lung]
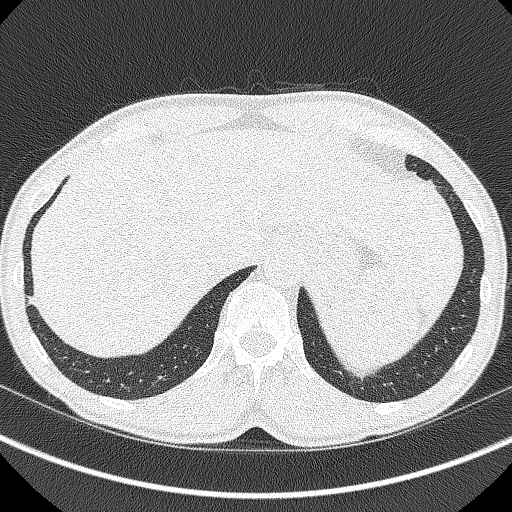
[im 116/365  lung]
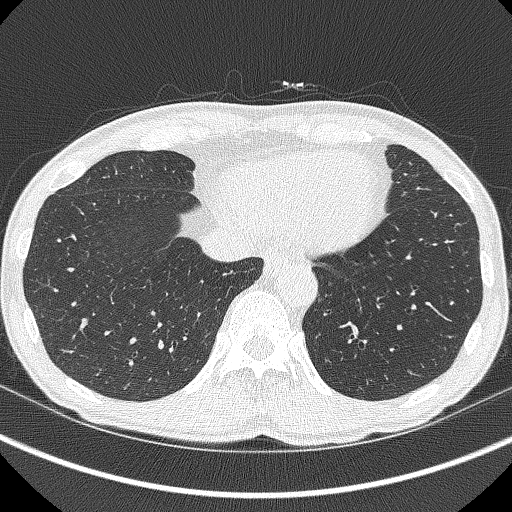
[im 133/365  mediastinal]
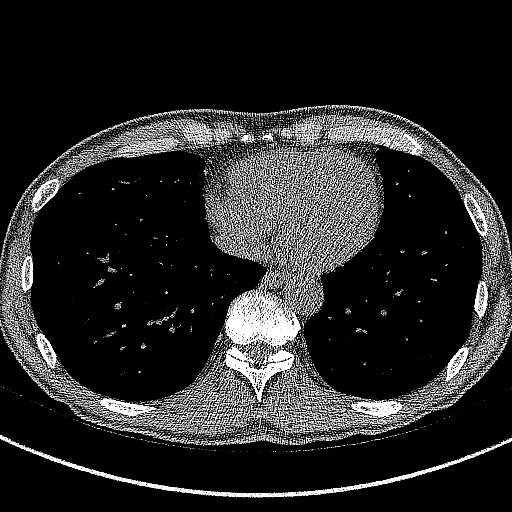
[im 133/365  lung]
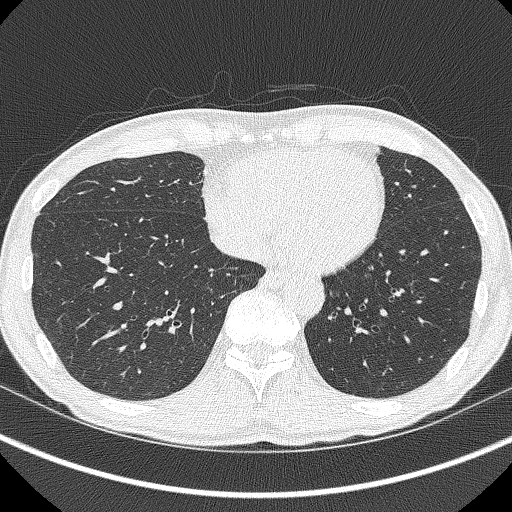
[im 166/365  lung]
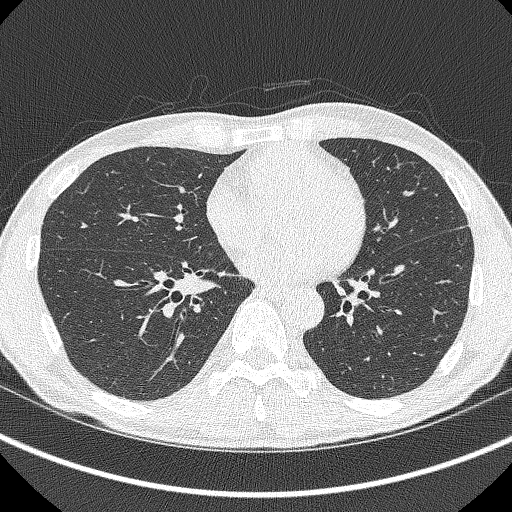
[im 199/365  lung]
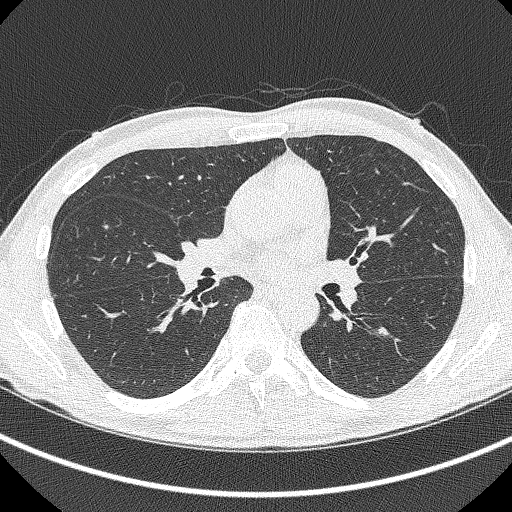
[im 232/365  lung]
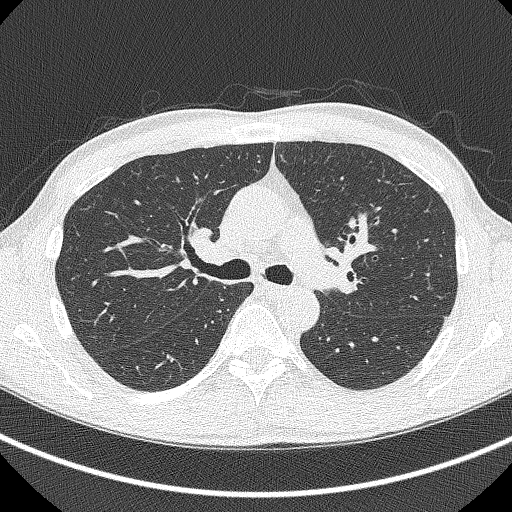
[im 249/365  mediastinal]
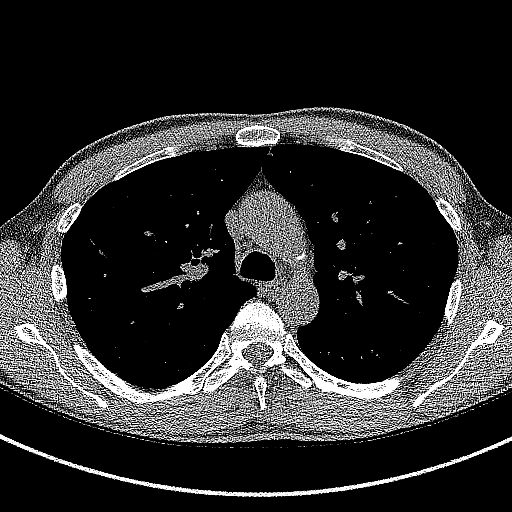
[im 249/365  lung]
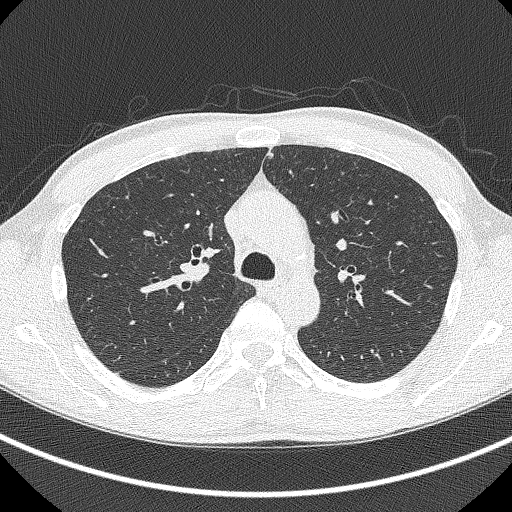
[im 282/365  lung]
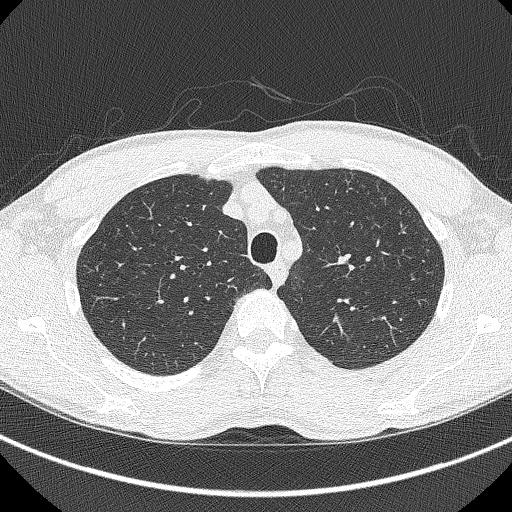
[im 315/365  lung]
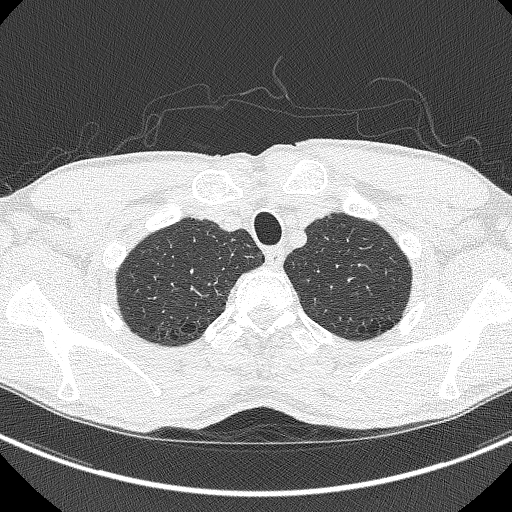
[im 348/365  lung]
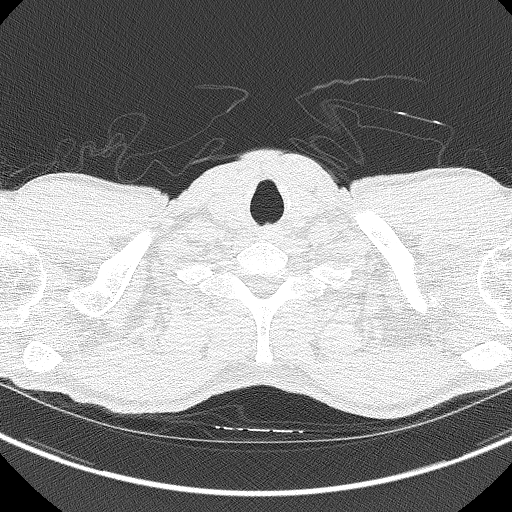

[Series 5: coronals lung 1.00 cor · coronal · 0.61mm/px · 3 of 230 slices shown]
[im 46/230  lung]
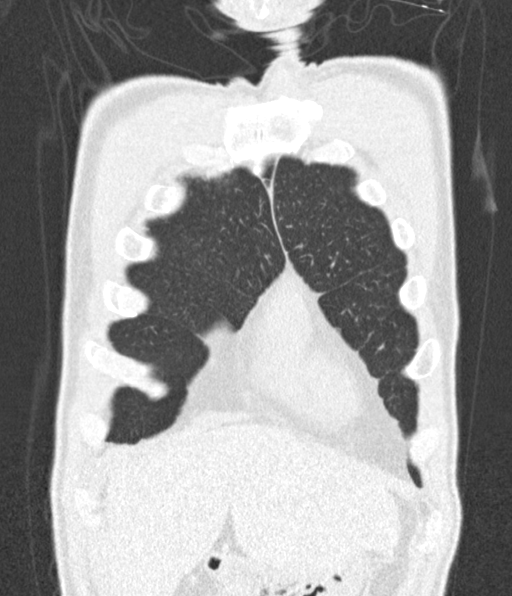
[im 92/230  lung]
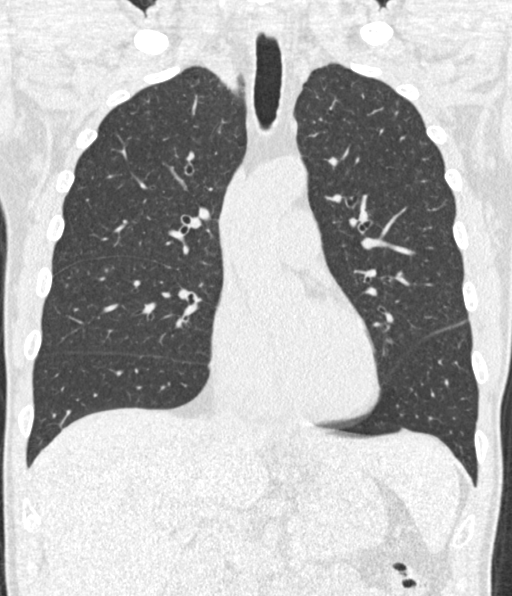
[im 138/230  lung]
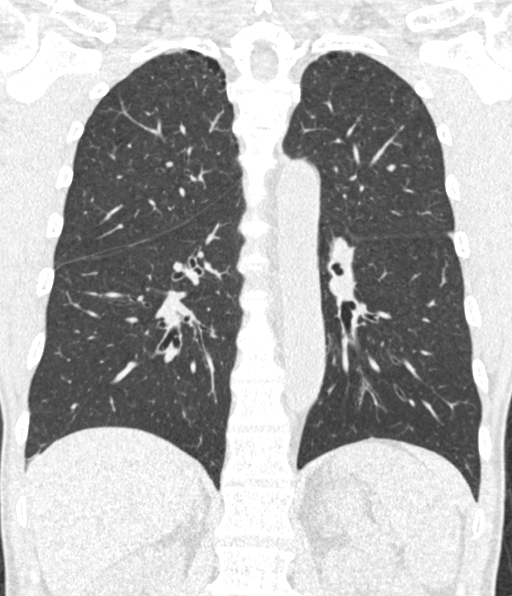

[15 of 40 positions shown; findings below may reference images not displayed]

FINDINGS: Cardiovascular: Normal heart size. No pericardial effusion. No
significant coronary artery calcifications. Mild atherosclerotic
disease of the thoracic aorta.

Mediastinum/Nodes: No pathologically enlarged lymph nodes seen in
the chest. Esophagus is unremarkable.

Lungs/Pleura: Central airways are patent. Mild predominantly
paraseptal emphysema. No consolidation, pleural effusion or
pneumothorax. Stable small solid pulmonary nodules.

Upper Abdomen: Unchanged mild left adrenal gland thickening. No
acute abnormality.

Musculoskeletal: No chest wall mass or suspicious bone lesions
identified.
IMPRESSION: 1. Lung-RADS 2, benign appearance or behavior. Continue annual
screening with low-dose chest CT without contrast in 12 months.
2. Aortic Atherosclerosis (VUXE7-6JZ.Z) and Emphysema (VUXE7-RJV.2).

## 2021-12-15 ENCOUNTER — Other Ambulatory Visit: Payer: Self-pay

## 2021-12-15 DIAGNOSIS — Z87891 Personal history of nicotine dependence: Secondary | ICD-10-CM

## 2022-03-15 ENCOUNTER — Encounter: Payer: BC Managed Care – PPO | Admitting: Internal Medicine

## 2022-03-21 ENCOUNTER — Ambulatory Visit (INDEPENDENT_AMBULATORY_CARE_PROVIDER_SITE_OTHER): Payer: BC Managed Care – PPO | Admitting: Internal Medicine

## 2022-03-21 ENCOUNTER — Encounter: Payer: Self-pay | Admitting: Internal Medicine

## 2022-03-21 VITALS — BP 118/71 | HR 57 | Temp 97.7°F | Ht 66.97 in | Wt 144.2 lb

## 2022-03-21 DIAGNOSIS — N50819 Testicular pain, unspecified: Secondary | ICD-10-CM | POA: Diagnosis not present

## 2022-03-21 DIAGNOSIS — K4090 Unilateral inguinal hernia, without obstruction or gangrene, not specified as recurrent: Secondary | ICD-10-CM

## 2022-03-21 DIAGNOSIS — Z0000 Encounter for general adult medical examination without abnormal findings: Secondary | ICD-10-CM

## 2022-03-21 LAB — URINALYSIS, ROUTINE W REFLEX MICROSCOPIC
Bilirubin, UA: NEGATIVE
Glucose, UA: NEGATIVE
Ketones, UA: NEGATIVE
Leukocytes,UA: NEGATIVE
Nitrite, UA: NEGATIVE
RBC, UA: NEGATIVE
Specific Gravity, UA: 1.025 (ref 1.005–1.030)
Urobilinogen, Ur: 1 mg/dL (ref 0.2–1.0)
pH, UA: 7 (ref 5.0–7.5)

## 2022-03-21 NOTE — Progress Notes (Signed)
? ?BP 118/71   Pulse (!) 57   Temp 97.7 ?F (36.5 ?C) (Oral)   Ht 5' 6.97" (1.701 m)   Wt 144 lb 3.2 oz (65.4 kg)   SpO2 96%   BMI 22.61 kg/m?   ? ?Subjective:  ? ? Patient ID: Jesse Koch, male    DOB: [birth year], 66 y.o.   MRN: [id number] ? ?Chief Complaint  ?Patient presents with  ? Annual Exam  ? ? ?HPI: ?Jesse Koch is a 66 y.o. male ? ?Pt is here for a follow up he says he feels well, hasnt had any new changes to his medicines or medical problems since seen last.   ? ?Groin Pain ?The patient's pertinent negatives include no genital injury, genital itching, genital lesions, pelvic pain, penile discharge, penile pain, priapism, scrotal swelling or testicular pain. Primary symptoms comment: has an abnl feeling x 2 weeks.. The current episode started more than 1 year ago. The problem occurs intermittently. Pertinent negatives include no abdominal pain, anorexia, chest pain, chills, constipation, coughing, diarrhea, discolored urine, dysuria, fever, flank pain, frequency, headaches, hematuria, hesitancy, joint pain, joint swelling, nausea, painful intercourse, rash, shortness of breath, sore throat, urgency, urinary retention or vomiting.  ? ?Chief Complaint  ?Patient presents with  ? Annual Exam  ? ? ?Relevant past medical, surgical, family and social history reviewed and updated as indicated. Interim medical history since our last visit reviewed. ?Allergies and medications reviewed and updated. ? ?Review of Systems  ?Constitutional:  Negative for activity change, appetite change, chills, fatigue and fever.  ?HENT:  Negative for congestion, ear discharge, ear pain, facial swelling and sore throat.   ?Eyes:  Negative for pain, discharge and itching.  ?Respiratory:  Negative for cough, chest tightness, shortness of breath and wheezing.   ?Cardiovascular:  Negative for chest pain, palpitations and leg swelling.  ?Gastrointestinal:  Negative for abdominal distention, abdominal pain, anorexia, blood in stool,  constipation, diarrhea, nausea and vomiting.  ?Endocrine: Negative for cold intolerance, heat intolerance, polydipsia, polyphagia and polyuria.  ?Genitourinary:  Negative for difficulty urinating, dysuria, flank pain, frequency, hematuria, hesitancy, pelvic pain, penile discharge, penile pain, scrotal swelling, testicular pain and urgency.  ?Musculoskeletal:  Negative for arthralgias, gait problem, joint pain, joint swelling and myalgias.  ?Skin:  Negative for color change, rash and wound.  ?Neurological:  Negative for dizziness, tremors, speech difficulty, weakness, light-headedness, numbness and headaches.  ?Hematological:  Does not bruise/bleed easily.  ?Psychiatric/Behavioral:  Negative for agitation, confusion, decreased concentration, sleep disturbance and suicidal ideas.   ? ?Per HPI unless specifically indicated above ? ?   ?Objective:  ?  ?BP 118/71   Pulse (!) 57   Temp 97.7 ?F (36.5 ?C) (Oral)   Ht 5' 6.97" (1.701 m)   Wt 144 lb 3.2 oz (65.4 kg)   SpO2 96%   BMI 22.61 kg/m?   ?Wt Readings from Last 3 Encounters:  ?03/21/22 144 lb 3.2 oz (65.4 kg)  ?12/13/21 140 lb (63.5 kg)  ?11/23/20 138 lb 3.2 oz (62.7 kg)  ?  ?Physical Exam ?Vitals and nursing note reviewed.  ?Constitutional:   ?   General: He is not in acute distress. ?   Appearance: Normal appearance. He is not ill-appearing or diaphoretic.  ?HENT:  ?   Head: Normocephalic and atraumatic.  ?   Right Ear: Tympanic membrane and external ear normal. There is no impacted cerumen.  ?   Left Ear: External ear normal.  ?   Nose: No congestion or rhinorrhea.  ?  Mouth/Throat:  ?   Pharynx: No oropharyngeal exudate or posterior oropharyngeal erythema.  ?Eyes:  ?   Conjunctiva/sclera: Conjunctivae normal.  ?   Pupils: Pupils are equal, round, and reactive to light.  ?Cardiovascular:  ?   Rate and Rhythm: Normal rate and regular rhythm.  ?   Heart sounds: No murmur heard. ?  No friction rub. No gallop.  ?Pulmonary:  ?   Effort: No respiratory distress.   ?   Breath sounds: No stridor. No wheezing or rhonchi.  ?Chest:  ?   Chest wall: No tenderness.  ?Abdominal:  ?   General: Abdomen is flat. Bowel sounds are normal.  ?   Palpations: Abdomen is soft. There is no mass.  ?   Tenderness: There is no abdominal tenderness. There is no right CVA tenderness, left CVA tenderness, guarding or rebound.  ?   Hernia: No hernia is present.  ?Musculoskeletal:  ?   Cervical back: Normal range of motion and neck supple. No rigidity or tenderness.  ?   Left lower leg: No edema.  ?Skin: ?   General: Skin is warm and dry.  ?   Coloration: Skin is not jaundiced or pale.  ?   Findings: No bruising, erythema, lesion or rash.  ?Neurological:  ?   Mental Status: He is alert.  ?   Cranial Nerves: No cranial nerve deficit.  ?   Sensory: No sensory deficit.  ?   Motor: No weakness.  ?   Coordination: Coordination normal.  ?   Gait: Gait normal.  ?   Deep Tendon Reflexes: Reflexes normal.  ?Psychiatric:     ?   Mood and Affect: Mood normal.     ?   Behavior: Behavior normal.  ? ? ?Results for orders placed or performed in visit on 03/21/22  ?CBC with Differential/Platelet  ?Result Value Ref Range  ? WBC 5.2 3.4 - 10.8 x10E3/uL  ? RBC 4.56 4.14 - 5.80 x10E6/uL  ? Hemoglobin 14.3 13.0 - 17.7 g/dL  ? Hematocrit 41.8 37.5 - 51.0 %  ? MCV 92 79 - 97 fL  ? MCH 31.4 26.6 - 33.0 pg  ? MCHC 34.2 31.5 - 35.7 g/dL  ? RDW 12.9 11.6 - 15.4 %  ? Platelets 244 150 - 450 x10E3/uL  ? Neutrophils 63 Not Estab. %  ? Lymphs 23 Not Estab. %  ? Monocytes 11 Not Estab. %  ? Eos 2 Not Estab. %  ? Basos 1 Not Estab. %  ? Neutrophils Absolute 3.3 1.4 - 7.0 x10E3/uL  ? Lymphocytes Absolute 1.2 0.7 - 3.1 x10E3/uL  ? Monocytes Absolute 0.6 0.1 - 0.9 x10E3/uL  ? EOS (ABSOLUTE) 0.1 0.0 - 0.4 x10E3/uL  ? Basophils Absolute 0.0 0.0 - 0.2 x10E3/uL  ? Immature Granulocytes 0 Not Estab. %  ? Immature Grans (Abs) 0.0 0.0 - 0.1 x10E3/uL  ?Comprehensive metabolic panel  ?Result Value Ref Range  ? Glucose 84 70 - 99 mg/dL  ? BUN 14  8 - 27 mg/dL  ? Creatinine, Ser 0.96 0.76 - 1.27 mg/dL  ? eGFR 89 >59 mL/min/1.73  ? BUN/Creatinine Ratio 15 10 - 24  ? Sodium 138 134 - 144 mmol/L  ? Potassium 4.4 3.5 - 5.2 mmol/L  ? Chloride 101 96 - 106 mmol/L  ? CO2 23 20 - 29 mmol/L  ? Calcium 8.4 (L) 8.6 - 10.2 mg/dL  ? Total Protein 6.2 6.0 - 8.5 g/dL  ? Albumin 4.1 3.8 - 4.8 g/dL  ?  Globulin, Total 2.1 1.5 - 4.5 g/dL  ? Albumin/Globulin Ratio 2.0 1.2 - 2.2  ? Bilirubin Total 0.4 0.0 - 1.2 mg/dL  ? Alkaline Phosphatase 91 44 - 121 IU/L  ? AST 19 0 - 40 IU/L  ? ALT 15 0 - 44 IU/L  ?TSH  ?Result Value Ref Range  ? TSH 3.400 0.450 - 4.500 uIU/mL  ?PSA  ?Result Value Ref Range  ? Prostate Specific Ag, Serum 1.2 0.0 - 4.0 ng/mL  ?Urinalysis, Routine w reflex microscopic  ?Result Value Ref Range  ? Specific Gravity, UA 1.025 1.005 - 1.030  ? pH, UA 7.0 5.0 - 7.5  ? Color, UA Yellow Yellow  ? Appearance Ur Clear Clear  ? Leukocytes,UA Negative Negative  ? Protein,UA Trace (A) Negative/Trace  ? Glucose, UA Negative Negative  ? Ketones, UA Negative Negative  ? RBC, UA Negative Negative  ? Bilirubin, UA Negative Negative  ? Urobilinogen, Ur 1.0 0.2 - 1.0 mg/dL  ? Nitrite, UA Negative Negative  ? ?   ? ?No current outpatient medications on file.  ? ? ?Assessment & Plan:  ?PHYSICAL :  Physical Wnl ?will check CMP, FLP, CBC,TSH, PSA. ? ?Problem List Items Addressed This Visit   ? ?  ? Other  ? Annual physical exam - Primary  ? Relevant Orders  ? CBC with Differential/Platelet (Completed)  ? Comprehensive metabolic panel (Completed)  ? TSH (Completed)  ? PSA (Completed)  ? Urinalysis, Routine w reflex microscopic (Completed)  ? Unilateral inguinal hernia without obstruction or gangrene  ? Testicular discomfort  ? Relevant Orders  ? Ambulatory referral to Urology  ?  ? ?Orders Placed This Encounter  ?Procedures  ? CBC with Differential/Platelet  ? Comprehensive metabolic panel  ? TSH  ? PSA  ? Urinalysis, Routine w reflex microscopic  ? Ambulatory referral to Urology  ?   ? ?No orders of the defined types were placed in this encounter. ?  ? ?Follow up plan: ?Return in about 1 year (around 03/22/2023). ? ?

## 2022-03-22 LAB — CBC WITH DIFFERENTIAL/PLATELET
Basophils Absolute: 0 10*3/uL (ref 0.0–0.2)
Basos: 1 %
EOS (ABSOLUTE): 0.1 10*3/uL (ref 0.0–0.4)
Eos: 2 %
Hematocrit: 41.8 % (ref 37.5–51.0)
Hemoglobin: 14.3 g/dL (ref 13.0–17.7)
Immature Grans (Abs): 0 10*3/uL (ref 0.0–0.1)
Immature Granulocytes: 0 %
Lymphocytes Absolute: 1.2 10*3/uL (ref 0.7–3.1)
Lymphs: 23 %
MCH: 31.4 pg (ref 26.6–33.0)
MCHC: 34.2 g/dL (ref 31.5–35.7)
MCV: 92 fL (ref 79–97)
Monocytes Absolute: 0.6 10*3/uL (ref 0.1–0.9)
Monocytes: 11 %
Neutrophils Absolute: 3.3 10*3/uL (ref 1.4–7.0)
Neutrophils: 63 %
Platelets: 244 10*3/uL (ref 150–450)
RBC: 4.56 x10E6/uL (ref 4.14–5.80)
RDW: 12.9 % (ref 11.6–15.4)
WBC: 5.2 10*3/uL (ref 3.4–10.8)

## 2022-03-22 LAB — COMPREHENSIVE METABOLIC PANEL
ALT: 15 IU/L (ref 0–44)
AST: 19 IU/L (ref 0–40)
Albumin/Globulin Ratio: 2 (ref 1.2–2.2)
Albumin: 4.1 g/dL (ref 3.8–4.8)
Alkaline Phosphatase: 91 IU/L (ref 44–121)
BUN/Creatinine Ratio: 15 (ref 10–24)
BUN: 14 mg/dL (ref 8–27)
Bilirubin Total: 0.4 mg/dL (ref 0.0–1.2)
CO2: 23 mmol/L (ref 20–29)
Calcium: 8.4 mg/dL — ABNORMAL LOW (ref 8.6–10.2)
Chloride: 101 mmol/L (ref 96–106)
Creatinine, Ser: 0.96 mg/dL (ref 0.76–1.27)
Globulin, Total: 2.1 g/dL (ref 1.5–4.5)
Glucose: 84 mg/dL (ref 70–99)
Potassium: 4.4 mmol/L (ref 3.5–5.2)
Sodium: 138 mmol/L (ref 134–144)
Total Protein: 6.2 g/dL (ref 6.0–8.5)
eGFR: 89 mL/min/{1.73_m2} (ref >59)

## 2022-03-22 LAB — PSA: Prostate Specific Ag, Serum: 1.2 ng/mL (ref 0.0–4.0)

## 2022-03-22 LAB — TSH: TSH: 3.4 u[IU]/mL (ref 0.450–4.500)

## 2022-04-05 ENCOUNTER — Ambulatory Visit: Payer: BC Managed Care – PPO | Admitting: Urology

## 2022-04-05 ENCOUNTER — Encounter: Payer: Self-pay | Admitting: Urology

## 2022-04-05 VITALS — BP 118/72 | HR 69 | Ht 66.0 in | Wt 144.0 lb

## 2022-04-05 DIAGNOSIS — R1031 Right lower quadrant pain: Secondary | ICD-10-CM

## 2022-04-05 DIAGNOSIS — N50819 Testicular pain, unspecified: Secondary | ICD-10-CM

## 2022-04-05 DIAGNOSIS — N50811 Right testicular pain: Secondary | ICD-10-CM | POA: Diagnosis not present

## 2022-04-05 LAB — URINALYSIS, COMPLETE
Bilirubin, UA: NEGATIVE
Glucose, UA: NEGATIVE
Ketones, UA: NEGATIVE
Leukocytes,UA: NEGATIVE
Nitrite, UA: NEGATIVE
Protein,UA: NEGATIVE
Specific Gravity, UA: >1.03 — ABNORMAL HIGH (ref 1.005–1.030)
Urobilinogen, Ur: 0.2 mg/dL (ref 0.2–1.0)
pH, UA: 6 (ref 5.0–7.5)

## 2022-04-05 LAB — MICROSCOPIC EXAMINATION

## 2022-04-05 NOTE — Progress Notes (Signed)
? ?04/06/22 ?9:07 AM  ? ?Jesse Koch ?1956/06/24 ?[id number] ? ?Referring provider:  ?Charlynne Cousins, MD ?[address] [place],  [place] [address] ?Chief Complaint  ?Patient presents with  ? Testicle Pain  ? ? ? ?HPI: ?Jesse Koch is a 66 y.o.male who presents today for further evaluation of discomfort in testicles.  ? ?He was seen by his PCP, Dr Neomia Dear, on 03/21/2022. He was noted at the time to have an abnormal feeling in his groin that is intermittent but has been ongoing for a year, worse of the last month. UA was unremarkable.  ? ?He feels pain in his right lower quadrant which radiates to his right testicle.  It is fairly random in nature, not exacerbated with activity, sitting, standing,.  It is relatively sharp at times and dull others.  He denies any inguinal bulging.  He denies any groin injuries or mechanical trauma.  He denies any scrotal swelling.  He does mention that his right testicle ascends somewhat and can be painful during sexual activity. ? ?His most recent PSA is 1.2 on 03/21/2022. ? ?He has no other urinary symptoms besides testicular pain. Patient denies any modifying or aggravating factors.  Patient denies any gross hematuria, dysuria or suprapubic/flank pain.  Patient denies any fevers, chills, nausea or vomiting.  ? ?He is a former smoker he smoked for about 40 packs a year 43 years.  ? ?Denies a personal history of kidney stones. ? ? ? ?PMH ?Past Medical History:  ?Diagnosis Date  ? Wears dentures   ? partial bottom  ? ? ?Surgical History: ?Past Surgical History:  ?Procedure Laterality Date  ? COLONOSCOPY    ? COLONOSCOPY WITH PROPOFOL N/A 11/22/2018  ? Procedure: COLONOSCOPY WITH PROPOFOL;  Surgeon: Lucilla Lame, MD;  Location: [facility];  Service: Endoscopy;  Laterality: N/A;  ? POLYPECTOMY  11/22/2018  ? Procedure: POLYPECTOMY INTESTINAL;  Surgeon: Lucilla Lame, MD;  Location: [facility];  Service: Endoscopy;;  ? ? ?Home Medications:  ?Allergies as of 04/05/2022   ? ?   Reactions  ?  Oxycodone Nausea Only  ? Penicillins Itching  ? ?  ? ?  ?Medication List  ?  ?as of Apr 05, 2022 11:59 PM ?  ?You have not been prescribed any medications. ?  ? ? ?Allergies:  ?Allergies  ?Allergen Reactions  ? Oxycodone Nausea Only  ? Penicillins Itching  ? ? ?Family History: ?Family History  ?Problem Relation Age of Onset  ? Cancer Mother   ?     lung  ? ? ?Social History:  reports that he quit smoking about 11 years ago. His smoking use included cigarettes. He has a 40.00 pack-year smoking history. He has never used smokeless tobacco. He reports current alcohol use of about 2.0 standard drinks per week. He reports that he does not use drugs. ? ? ?Physical Exam: ?BP 118/72   Pulse 69   Ht '5\' 6"'$  (1.676 m)   Wt 144 lb (65.3 kg)   BMI 23.24 kg/m?   ?Constitutional:  Alert and oriented, No acute distress. ?HEENT: [place] AT, moist mucus membranes.  Trachea midline, no masses. ?Cardiovascular: No clubbing, cyanosis, or edema. ?Respiratory: Normal respiratory effort, no increased work of breathing. ?GI: Bilateral inguinal exam no obvious hernia; examined standing with Valsalva ?GU:  No CVA tenderness.  No bladder fullness or masses.  Patient with circumcised phallus. Foreskin easily retracted  Urethral meatus is patent.  No penile discharge. No penile lesions or rashes. Scrotum  without lesions, cysts, rashes and/or edema. Scarring on left dorsal aspect of penis bilateral. Bilateral hydroceles, testicles are located scrotally bilaterally. No masses are appreciated in the testicles. Left and right epididymis are normal. ?Skin: No rashes, bruises or suspicious lesions. ?Neurologic: Grossly intact, no focal deficits, moving all 4 extremities. ?Psychiatric: Normal mood and affect. ? ?Laboratory Data: ?Lab Results  ?Component Value Date  ? CREATININE 0.96 03/21/2022  ? ? ?Results for orders placed or performed in visit on 04/05/22  ?Microscopic Examination  ? Urine  ?Result Value Ref Range  ? WBC, UA 0-5 0 - 5 /hpf  ? RBC 3-10  (A) 0 - 2 /hpf  ? Epithelial Cells (non renal) 0-10 0 - 10 /hpf  ? Mucus, UA Present (A) Not Estab.  ? Bacteria, UA Few None seen/Few  ?Urinalysis, Complete  ?Result Value Ref Range  ? Specific Gravity, UA >1.030 (H) 1.005 - 1.030  ? pH, UA 6.0 5.0 - 7.5  ? Color, UA Yellow Yellow  ? Appearance Ur Clear Clear  ? Leukocytes,UA Negative Negative  ? Protein,UA Negative Negative/Trace  ? Glucose, UA Negative Negative  ? Ketones, UA Negative Negative  ? RBC, UA Trace (A) Negative  ? Bilirubin, UA Negative Negative  ? Urobilinogen, Ur 0.2 0.2 - 1.0 mg/dL  ? Nitrite, UA Negative Negative  ? Microscopic Examination See below:   ? ? ?Assessment & Plan:   ? ?Microscopic hematuria  ?- We discussed the differential diagnosis for microscopic hematuria ?including nephrolithiasis, renal or upper tract tumors, bladder ?stones, UTIs, or bladder tumors as well as undetermined etiologies. ?-Former extensive smoker, discussed that the guidelines would recommend CT urogram however given the relatively small amount of blood as well as right lower quadrant pain radiating to the testicle which is very suspicious for possible stone, we elected to proceed with CT stone protocol at this time to reduce costs as well as radiation exposure ?-Plan for follow-up CT scan, if there is no stone, we will proceed with cystoscopy to which he is agreeable ? ?2. Groin pain ?- As above  ?-No obvious hernia, will evaluate with CT to rule out occult hernia ?-Also discussed differential diagnosis including referred right hip pain ? ?3.  History of tobacco abuse ?-No longer smoking but has at least a 30-pack-year history, higher risk for underlying bladder pathology ? ?Follow-up CT stone/possible cystoscopy ? ?Conley Rolls as a Education administrator for Hollice Espy, MD.,have documented all relevant documentation on the behalf of Hollice Espy, MD,as directed by  Hollice Espy, MD while in the presence of Hollice Espy, MD. ? ? ?[facility] ?[address] ?[place], [place] [address] ?[phone] ? ?

## 2022-04-05 NOTE — Patient Instructions (Signed)

## 2022-04-19 ENCOUNTER — Ambulatory Visit
Admission: RE | Admit: 2022-04-19 | Discharge: 2022-04-19 | Disposition: A | Discharge status: Home / Self Care | Payer: BC Managed Care – PPO | Source: Ambulatory Visit | Attending: Urology | Admitting: Urology

## 2022-04-19 DIAGNOSIS — N50819 Testicular pain, unspecified: Secondary | ICD-10-CM | POA: Diagnosis present

## 2022-04-19 DIAGNOSIS — R1031 Right lower quadrant pain: Secondary | ICD-10-CM | POA: Diagnosis present

## 2022-04-21 ENCOUNTER — Encounter: Payer: Self-pay | Admitting: Urology

## 2022-05-02 ENCOUNTER — Ambulatory Visit (INDEPENDENT_AMBULATORY_CARE_PROVIDER_SITE_OTHER): Payer: BC Managed Care – PPO | Admitting: Urology

## 2022-05-02 ENCOUNTER — Encounter: Payer: Self-pay | Admitting: Urology

## 2022-05-02 VITALS — BP 132/88 | HR 99 | Ht 66.0 in | Wt 144.0 lb

## 2022-05-02 DIAGNOSIS — N50819 Testicular pain, unspecified: Secondary | ICD-10-CM

## 2022-05-02 DIAGNOSIS — R3129 Other microscopic hematuria: Secondary | ICD-10-CM | POA: Diagnosis not present

## 2022-05-02 LAB — URINALYSIS, COMPLETE
Bilirubin, UA: NEGATIVE
Glucose, UA: NEGATIVE
Ketones, UA: NEGATIVE
Leukocytes,UA: NEGATIVE
Nitrite, UA: NEGATIVE
RBC, UA: NEGATIVE
Specific Gravity, UA: 1.03 (ref 1.005–1.030)
Urobilinogen, Ur: 0.2 mg/dL (ref 0.2–1.0)
pH, UA: 6 (ref 5.0–7.5)

## 2022-05-02 LAB — MICROSCOPIC EXAMINATION

## 2022-05-02 NOTE — Progress Notes (Signed)
   05/02/22  CC:  Chief Complaint  Patient presents with   Cysto    HPI: Jesse Koch is a 66 y.o. male with a personal history of microscopic hematuria, groin pain, and tobacco abuse, who presents today for a cystoscopy with CT results.   He was seen by his PCP, Dr Neomia Dear, on 03/21/2022. He was noted at the time to have an abnormal feeling in his groin that is intermittent but has been ongoing for a year, worse of the last month. UA was unremarkable.    He underwent CT renal stone study on 04/19/2022 that visualized no abnormality within the abdomen or pelvis. Specifically, no obstructive ureteral or bladder calculi identified. Punctate nonobstructive left nephrolithiasis.  Vitals:   05/02/22 1533  BP: 132/88  Pulse: 99   NED. A&Ox3.   No respiratory distress   Abd soft, NT, ND Normal phallus with bilateral descended testicles  Cystoscopy Procedure Note  Patient identification was confirmed, informed consent was obtained, and patient was prepped using Betadine solution.  Lidocaine jelly was administered per urethral meatus.     Pre-Procedure: - Inspection reveals a normal caliber ureteral meatus.  Procedure: The flexible cystoscope was introduced without difficulty - No urethral strictures/lesions are present. - Normal prostate  - Normal bladder neck - Bilateral ureteral orifices identified - Bladder mucosa  reveals no ulcers, tumors, or lesions - No bladder stones - No trabeculation - small left lateral wall diverticulum  Retroflexion shows unremarkable    Post-Procedure: - Patient tolerated the procedure well   Assessment/ Plan: Microscopic hematuria  - Cystoscopy today was unremarkable  - CT scan was reviewed and visualized punctate nonobstructive left nephrolithiasis.  2. Testicular pain - Improving; continue supportive care no underlying pathology noted.   F/u prn  Conley Rolls as a scribe for Hollice Espy, MD.,have documented all relevant  documentation on the behalf of Hollice Espy, MD,as directed by  Hollice Espy, MD while in the presence of Hollice Espy, MD.  I have reviewed the above documentation for accuracy and completeness, and I agree with the above.   Hollice Espy, MD

## 2022-12-12 ENCOUNTER — Ambulatory Visit
Admission: RE | Admit: 2022-12-12 | Discharge: 2022-12-12 | Disposition: A | Discharge status: Home / Self Care | Payer: BC Managed Care – PPO | Source: Ambulatory Visit | Attending: Acute Care | Admitting: Acute Care

## 2022-12-12 DIAGNOSIS — Z87891 Personal history of nicotine dependence: Secondary | ICD-10-CM | POA: Diagnosis not present

## 2022-12-12 NOTE — Progress Notes (Signed)
Please get scheduled for physical in April. Thanks.

## 2022-12-13 ENCOUNTER — Other Ambulatory Visit: Payer: Self-pay

## 2022-12-13 ENCOUNTER — Ambulatory Visit: Admission: RE | Admit: 2022-12-13 | Payer: BC Managed Care – PPO | Source: Ambulatory Visit

## 2022-12-13 DIAGNOSIS — Z87891 Personal history of nicotine dependence: Secondary | ICD-10-CM

## 2022-12-13 DIAGNOSIS — Z122 Encounter for screening for malignant neoplasm of respiratory organs: Secondary | ICD-10-CM

## 2023-08-14 ENCOUNTER — Telehealth: Payer: Self-pay | Admitting: Family Medicine

## 2023-08-14 NOTE — Telephone Encounter (Signed)
Error

## 2023-10-30 ENCOUNTER — Other Ambulatory Visit: Payer: Self-pay | Admitting: Acute Care

## 2023-10-30 DIAGNOSIS — Z87891 Personal history of nicotine dependence: Secondary | ICD-10-CM

## 2023-10-30 DIAGNOSIS — Z122 Encounter for screening for malignant neoplasm of respiratory organs: Secondary | ICD-10-CM

## 2023-12-14 ENCOUNTER — Ambulatory Visit: Admission: RE | Admit: 2023-12-14 | Payer: Medicare HMO | Source: Ambulatory Visit

## 2024-05-27 ENCOUNTER — Telehealth: Payer: Self-pay

## 2024-05-27 ENCOUNTER — Other Ambulatory Visit: Payer: Self-pay

## 2024-05-27 DIAGNOSIS — Z1211 Encounter for screening for malignant neoplasm of colon: Secondary | ICD-10-CM

## 2024-05-27 MED ORDER — NA SULFATE-K SULFATE-MG SULF 17.5-3.13-1.6 GM/177ML PO SOLN: 1.0000 | Freq: Once | ORAL | 0 refills | Status: AC

## 2024-05-27 NOTE — Telephone Encounter (Signed)
 Gastroenterology Pre-Procedure Review  Request Date: 08/11/24 Requesting Physician: Dr. Jinny  PATIENT REVIEW QUESTIONS: The patient responded to the following health history questions as indicated:    1. Are you having any GI issues? no 2. Do you have a personal history of Polyps? no 3. Do you have a family history of Colon Cancer or Polyps? no 4. Diabetes Mellitus? no 5. Joint replacements in the past 12 months?no 6. Major health problems in the past 3 months?no 7. Any artificial heart valves, MVP, or defibrillator?no    MEDICATIONS & ALLERGIES:    Patient reports the following regarding taking any anticoagulation/antiplatelet therapy:   Plavix, Coumadin, Eliquis, Xarelto, Lovenox, Pradaxa, Brilinta, or Effient? no Aspirin? no  Patient confirms/reports the following medications:  No current outpatient medications on file.   No current facility-administered medications for this visit.    Patient confirms/reports the following allergies:  Allergies  Allergen Reactions   Oxycodone Nausea Only   Penicillins Itching    No orders of the defined types were placed in this encounter.   AUTHORIZATION INFORMATION Primary Insurance: 1D#: Group #:  Secondary Insurance: 1D#: Group #:  SCHEDULE INFORMATION: Date: 08/11/24 Time: Location: [facility]

## 2024-06-03 ENCOUNTER — Other Ambulatory Visit: Payer: Self-pay | Admitting: Family Medicine

## 2024-06-03 DIAGNOSIS — Z139 Encounter for screening, unspecified: Secondary | ICD-10-CM

## 2024-06-04 ENCOUNTER — Ambulatory Visit
Admission: RE | Admit: 2024-06-04 | Discharge: 2024-06-04 | Disposition: A | Discharge status: Home / Self Care | Source: Ambulatory Visit | Attending: Family Medicine | Admitting: Family Medicine

## 2024-06-04 DIAGNOSIS — Z139 Encounter for screening, unspecified: Secondary | ICD-10-CM

## 2024-08-11 ENCOUNTER — Ambulatory Visit
Admission: RE | Admit: 2024-08-11 | Discharge: 2024-08-11 | Disposition: A | Discharge status: Home / Self Care | Attending: Gastroenterology | Admitting: Gastroenterology

## 2024-08-11 ENCOUNTER — Ambulatory Visit

## 2024-08-11 ENCOUNTER — Encounter: Payer: Self-pay | Admitting: Gastroenterology

## 2024-08-11 ENCOUNTER — Encounter
Admission: RE | Disposition: A | Discharge status: Home / Self Care | Payer: Self-pay | Source: Home / Self Care | Attending: Gastroenterology

## 2024-08-11 DIAGNOSIS — Z860100 Personal history of colon polyps, unspecified: Secondary | ICD-10-CM

## 2024-08-11 DIAGNOSIS — Z860101 Personal history of adenomatous and serrated colon polyps: Secondary | ICD-10-CM

## 2024-08-11 DIAGNOSIS — K635 Polyp of colon: Secondary | ICD-10-CM

## 2024-08-11 DIAGNOSIS — D125 Benign neoplasm of sigmoid colon: Secondary | ICD-10-CM | POA: Insufficient documentation

## 2024-08-11 DIAGNOSIS — Z1211 Encounter for screening for malignant neoplasm of colon: Secondary | ICD-10-CM

## 2024-08-11 DIAGNOSIS — K640 First degree hemorrhoids: Secondary | ICD-10-CM | POA: Diagnosis not present

## 2024-08-11 DIAGNOSIS — D124 Benign neoplasm of descending colon: Secondary | ICD-10-CM

## 2024-08-11 HISTORY — PX: COLONOSCOPY: SHX5424

## 2024-08-11 HISTORY — PX: POLYPECTOMY: SHX149

## 2024-08-11 SURGERY — COLONOSCOPY
Anesthesia: General

## 2024-08-11 MED ORDER — SODIUM CHLORIDE 0.9 % IV SOLN: INTRAVENOUS | Status: DC

## 2024-08-11 MED ORDER — PROPOFOL 500 MG/50ML IV EMUL
INTRAVENOUS | Status: DC | PRN
  Administered 2024-08-11: 150 ug/kg/min via INTRAVENOUS

## 2024-08-11 MED ORDER — HYDRALAZINE HCL 20 MG/ML IJ SOLN
INTRAMUSCULAR | Status: AC
  Filled 2024-08-11: qty 1

## 2024-08-11 MED ORDER — LIDOCAINE HCL (CARDIAC) PF 100 MG/5ML IV SOSY
PREFILLED_SYRINGE | INTRAVENOUS | Status: DC | PRN
  Administered 2024-08-11: 100 mg via INTRAVENOUS

## 2024-08-11 MED ORDER — PROPOFOL 1000 MG/100ML IV EMUL
INTRAVENOUS | Status: AC
Start: 2024-08-11 — End: 2024-08-11
  Filled 2024-08-11: qty 100

## 2024-08-11 MED ORDER — LIDOCAINE HCL (PF) 2 % IJ SOLN
INTRAMUSCULAR | Status: AC
  Filled 2024-08-11: qty 5

## 2024-08-11 MED ORDER — PROPOFOL 10 MG/ML IV BOLUS
INTRAVENOUS | Status: DC | PRN
  Administered 2024-08-11: 100 mg via INTRAVENOUS

## 2024-08-11 NOTE — Op Note (Signed)
 [facility] Gastroenterology Patient Name: Jesse Koch Procedure Date: 08/11/2024 9:10 AM MRN: [id number] Account #: [id number] Date of Birth: August 05, 1956 Admit Type: Outpatient Age: 68 Room: [facility] ENDO ROOM 4 Gender: Male Note Status: Finalized Instrument Name: Colon Scope [id number] Procedure:             Colonoscopy Indications:           High risk colon cancer surveillance: Personal history                         of colonic polyps Providers:             Rogelia Copping MD, MD Referring MD:          No Local Md, MD (Referring MD) Medicines:             Propofol  per Anesthesia Complications:         No immediate complications. Procedure:             Pre-Anesthesia Assessment:                        - Prior to the procedure, a History and Physical was                         performed, and patient medications and allergies were                         reviewed. The patient's tolerance of previous                         anesthesia was also reviewed. The risks and benefits                         of the procedure and the sedation options and risks                         were discussed with the patient. All questions were                         answered, and informed consent was obtained. Prior                         Anticoagulants: The patient has taken no anticoagulant                         or antiplatelet agents. ASA Grade Assessment: II - A                         patient with mild systemic disease. After reviewing                         the risks and benefits, the patient was deemed in                         satisfactory condition to undergo the procedure.                        After obtaining informed consent, the colonoscope was  passed under direct vision. Throughout the procedure,                         the patient's blood pressure, pulse, and oxygen                         saturations were monitored continuously. The                          Colonoscope was introduced through the anus and                         advanced to the the cecum, identified by appendiceal                         orifice and ileocecal valve. The colonoscopy was                         performed without difficulty. The patient tolerated                         the procedure well. The quality of the bowel                         preparation was excellent. Findings:      The perianal and digital rectal examinations were normal.      Two sessile polyps were found in the sigmoid colon. The polyps were 2 to       3 mm in size. These polyps were removed with a cold snare. Resection and       retrieval were complete.      A 5 mm polyp was found in the descending colon. The polyp was sessile.       The polyp was removed with a cold snare. Resection and retrieval were       complete.      Non-bleeding internal hemorrhoids were found during retroflexion. The       hemorrhoids were Grade I (internal hemorrhoids that do not prolapse). Impression:            - Two 2 to 3 mm polyps in the sigmoid colon, removed                         with a cold snare. Resected and retrieved.                        - One 5 mm polyp in the descending colon, removed with                         a cold snare. Resected and retrieved.                        - Non-bleeding internal hemorrhoids. Recommendation:        - Discharge patient to home.                        - Resume previous diet.                        - Continue present medications.                        -  Await pathology results.                        - Repeat colonoscopy in 7 years for surveillance. Procedure Code(s):     --- Professional ---                        [phone], Colonoscopy, flexible; with removal of                         tumor(s), polyp(s), or other lesion(s) by snare                         technique Diagnosis Code(s):     --- Professional ---                        Z86.010, Personal history of  colonic polyps                        D12.5, Benign neoplasm of sigmoid colon CPT copyright 2022 American Medical Association. All rights reserved. The codes documented in this report are preliminary and upon coder review may  be revised to meet current compliance requirements. Rogelia Copping MD, MD 08/11/2024 9:38:34 AM This report has been signed electronically. Number of Addenda: 0 Note Initiated On: 08/11/2024 9:10 AM Scope Withdrawal Time: 0 hours 10 minutes 13 seconds  Total Procedure Duration: 0 hours 11 minutes 40 seconds  Estimated Blood Loss:  Estimated blood loss: none.      [facility]

## 2024-08-11 NOTE — H&P (Signed)
 Rogelia Copping, MD [facility] [address], [address] [place], [place] [address] Phone:[phone] Fax : [phone]  Primary Care Physician:  Odell Chard, Edra GRADE, MD Primary Gastroenterologist:  Dr. Copping  Pre-Procedure History & Physical: HPI:  Jesse Koch is a 68 y.o. male is here for an colonoscopy.   Past Medical History:  Diagnosis Date   Wears dentures    partial bottom    Past Surgical History:  Procedure Laterality Date   COLONOSCOPY     COLONOSCOPY WITH PROPOFOL  N/A 11/22/2018   Procedure: COLONOSCOPY WITH PROPOFOL ;  Surgeon: Copping Rogelia, MD;  Location: [facility];  Service: Endoscopy;  Laterality: N/A;   POLYPECTOMY  11/22/2018   Procedure: POLYPECTOMY INTESTINAL;  Surgeon: Copping Rogelia, MD;  Location: [facility];  Service: Endoscopy;;    Prior to Admission medications   Not on File    Allergies as of 05/27/2024 - Review Complete 05/27/2024  Allergen Reaction Noted   Oxycodone Nausea Only 11/13/2018   Penicillins Itching 05/04/2015    Family History  Problem Relation Age of Onset   Cancer Mother        lung    Social History   Socioeconomic History   Marital status: Married    Spouse name: Not on file   Number of children: Not on file   Years of education: Not on file   Highest education level: Not on file  Occupational History   Not on file  Tobacco Use   Smoking status: Former    Current packs/day: 0.00    Average packs/day: 1 pack/day for 40.0 years (40.0 ttl pk-yrs)    Types: Cigarettes    Start date: 05/18/1970    Quit date: 05/18/2010    Years since quitting: 14.2   Smokeless tobacco: Never   Tobacco comments:    smokes occasional small cigar  Vaping Use   Vaping status: Every Day   Substances: Nicotine   Devices: Black Note  Substance and Sexual Activity   Alcohol use: Yes    Alcohol/week: 2.0 standard drinks of alcohol    Types: 2 Cans of beer per week    Comment: occasional   Drug use: No   Sexual activity:  Yes  Other Topics Concern   Not on file  Social History Narrative   Not on file   Social Drivers of Health   Financial Resource Strain: Not on file  Food Insecurity: Not on file  Transportation Needs: Not on file  Physical Activity: Not on file  Stress: Not on file  Social Connections: Not on file  Intimate Partner Violence: Not on file    Review of Systems: See HPI, otherwise negative ROS  Physical Exam: BP 124/72   Pulse 65   Temp (!) 96.5 F (35.8 C) (Temporal)   Resp 18   Ht 5' 8 (1.727 m)   Wt 62.6 kg   SpO2 100%   BMI 20.98 kg/m  General:   Alert,  pleasant and cooperative in NAD Head:  Normocephalic and atraumatic. Neck:  Supple; no masses or thyromegaly. Lungs:  Clear throughout to auscultation.    Heart:  Regular rate and rhythm. Abdomen:  Soft, nontender and nondistended. Normal bowel sounds, without guarding, and without rebound.   Neurologic:  Alert and  oriented x4;  grossly normal neurologically.  Impression/Plan: [name] is here for an colonoscopy to be performed for a history of adenomatous polyps on 2019   Risks, benefits, limitations, and alternatives regarding  colonoscopy have  been reviewed with the patient.  Questions have been answered.  All parties agreeable.   Rogelia Copping, MD  08/11/2024, 9:09 AM

## 2024-08-11 NOTE — Anesthesia Postprocedure Evaluation (Signed)
 Anesthesia Post Note  Patient: Jesse Koch  Procedure(s) Performed: COLONOSCOPY POLYPECTOMY, INTESTINE  Patient location during evaluation: Endoscopy Anesthesia Type: General Level of consciousness: awake and alert Pain management: pain level controlled Vital Signs Assessment: post-procedure vital signs reviewed and stable Respiratory status: spontaneous breathing, nonlabored ventilation and respiratory function stable Cardiovascular status: blood pressure returned to baseline and stable Postop Assessment: no apparent nausea or vomiting Anesthetic complications: no   No notable events documented.   Last Vitals:  Vitals:   08/11/24 0951 08/11/24 0959  BP: (!) 98/51 (!) 105/55  Pulse: (!) 58 60  Resp: 18 18  Temp:    SpO2: 100% 100%    Last Pain:  Vitals:   08/11/24 0959  TempSrc:   PainSc: 0-No pain                 Camellia Merilee Louder

## 2024-08-11 NOTE — Anesthesia Preprocedure Evaluation (Addendum)
 Anesthesia Evaluation  Patient identified by MRN, date of birth, ID band Patient awake    Reviewed: Allergy & Precautions, H&P , NPO status , Patient's Chart, lab work & pertinent test results  Airway Mallampati: II  TM Distance: >3 FB Neck ROM: full    Dental  (+) Implants   Pulmonary neg pulmonary ROS   Pulmonary exam normal        Cardiovascular negative cardio ROS Normal cardiovascular exam     Neuro/Psych negative neurological ROS  negative psych ROS   GI/Hepatic negative GI ROS, Neg liver ROS,,,  Endo/Other  negative endocrine ROS    Renal/GU negative Renal ROS  negative genitourinary   Musculoskeletal   Abdominal Normal abdominal exam  (+)   Peds  Hematology negative hematology ROS (+)   Anesthesia Other Findings Past Medical History: No date: Wears dentures     Comment:  partial bottom  Past Surgical History: No date: COLONOSCOPY 11/22/2018: COLONOSCOPY WITH PROPOFOL ; N/A     Comment:  Procedure: COLONOSCOPY WITH PROPOFOL ;  Surgeon: Jinny Carmine, MD;  Location: [facility];  Service:               Endoscopy;  Laterality: N/A; 11/22/2018: POLYPECTOMY     Comment:  Procedure: POLYPECTOMY INTESTINAL;  Surgeon: Jinny Carmine, MD;  Location: [facility];  Service:               Endoscopy;;     Reproductive/Obstetrics negative OB ROS                              Anesthesia Physical Anesthesia Plan  ASA: 2  Anesthesia Plan: General   Post-op Pain Management:    Induction:   PONV Risk Score and Plan: Propofol  infusion and TIVA  Airway Management Planned: Natural Airway  Additional Equipment:   Intra-op Plan:   Post-operative Plan:   Informed Consent: I have reviewed the patients History and Physical, chart, labs and discussed the procedure including the risks, benefits and alternatives for the proposed anesthesia with  the patient or authorized representative who has indicated his/her understanding and acceptance.     Dental Advisory Given  Plan Discussed with: CRNA and Surgeon  Anesthesia Plan Comments:          Anesthesia Quick Evaluation

## 2024-08-11 NOTE — Transfer of Care (Signed)
 Immediate Anesthesia Transfer of Care Note  Patient: Jesse Koch  Procedure(s) Performed: COLONOSCOPY POLYPECTOMY, INTESTINE  Patient Location: Endoscopy Unit  Anesthesia Type:General  Level of Consciousness: drowsy and responds to stimulation  Airway & Oxygen Therapy: Patient Spontanous Breathing  Post-op Assessment: Report given to RN and Post -op Vital signs reviewed and stable  Post vital signs: Reviewed and stable  Last Vitals:  Vitals Value Taken Time  BP 102/45 08/11/24 09:40  Temp 35.3 C 08/11/24 09:40  Pulse 59 08/11/24 09:40  Resp 18 08/11/24 09:40  SpO2 100 % 08/11/24 09:40    Last Pain:  Vitals:   08/11/24 0940  TempSrc: Tympanic  PainSc: Asleep         Complications: No notable events documented.

## 2024-08-12 ENCOUNTER — Ambulatory Visit: Payer: Self-pay | Admitting: Gastroenterology

## 2024-08-12 LAB — SURGICAL PATHOLOGY
